# Patient Record
Sex: Male | Born: 1950
Health system: Southern US, Community
[De-identification: ages and names within clinical notes are randomized; demographics above are authoritative.]

## PROBLEM LIST (undated history)

## (undated) DIAGNOSIS — E785 Hyperlipidemia, unspecified: Secondary | ICD-10-CM

## (undated) DIAGNOSIS — I10 Essential (primary) hypertension: Secondary | ICD-10-CM

## (undated) DIAGNOSIS — I509 Heart failure, unspecified: Secondary | ICD-10-CM

## (undated) DIAGNOSIS — E119 Type 2 diabetes mellitus without complications: Secondary | ICD-10-CM

## (undated) HISTORY — PX: ABDOMINAL SURGERY: SHX537

## (undated) HISTORY — DX: Hyperlipidemia, unspecified: E78.5

## (undated) HISTORY — DX: Essential (primary) hypertension: I10

## (undated) HISTORY — PX: LEG SURGERY: SHX1003

## (undated) HISTORY — DX: Heart failure, unspecified: I50.9

## (undated) HISTORY — DX: Type 2 diabetes mellitus without complications: E11.9

---

## 2000-12-20 ENCOUNTER — Emergency Department (HOSPITAL_COMMUNITY): Admission: EM | Admit: 2000-12-20 | Discharge: 2000-12-20 | Payer: Self-pay | Admitting: Emergency Medicine

## 2002-04-21 ENCOUNTER — Emergency Department (HOSPITAL_COMMUNITY): Admission: EM | Admit: 2002-04-21 | Discharge: 2002-04-21 | Payer: Self-pay | Admitting: Emergency Medicine

## 2011-03-31 DIAGNOSIS — Q181 Preauricular sinus and cyst: Secondary | ICD-10-CM | POA: Insufficient documentation

## 2011-07-14 DIAGNOSIS — D751 Secondary polycythemia: Secondary | ICD-10-CM | POA: Insufficient documentation

## 2011-10-23 DIAGNOSIS — H6980 Other specified disorders of Eustachian tube, unspecified ear: Secondary | ICD-10-CM | POA: Insufficient documentation

## 2011-10-23 DIAGNOSIS — H908 Mixed conductive and sensorineural hearing loss, unspecified: Secondary | ICD-10-CM | POA: Insufficient documentation

## 2011-10-23 DIAGNOSIS — H6690 Otitis media, unspecified, unspecified ear: Secondary | ICD-10-CM | POA: Insufficient documentation

## 2013-02-28 DIAGNOSIS — F329 Major depressive disorder, single episode, unspecified: Secondary | ICD-10-CM | POA: Insufficient documentation

## 2014-06-19 DIAGNOSIS — Z79899 Other long term (current) drug therapy: Secondary | ICD-10-CM | POA: Diagnosis not present

## 2014-06-19 DIAGNOSIS — R5383 Other fatigue: Secondary | ICD-10-CM | POA: Diagnosis not present

## 2014-07-05 DIAGNOSIS — E291 Testicular hypofunction: Secondary | ICD-10-CM | POA: Diagnosis not present

## 2014-08-08 DIAGNOSIS — E291 Testicular hypofunction: Secondary | ICD-10-CM | POA: Diagnosis not present

## 2014-09-10 DIAGNOSIS — D510 Vitamin B12 deficiency anemia due to intrinsic factor deficiency: Secondary | ICD-10-CM | POA: Diagnosis not present

## 2014-09-10 DIAGNOSIS — E785 Hyperlipidemia, unspecified: Secondary | ICD-10-CM | POA: Diagnosis not present

## 2014-09-10 DIAGNOSIS — E291 Testicular hypofunction: Secondary | ICD-10-CM | POA: Diagnosis not present

## 2014-09-10 DIAGNOSIS — E1142 Type 2 diabetes mellitus with diabetic polyneuropathy: Secondary | ICD-10-CM | POA: Diagnosis not present

## 2014-09-10 DIAGNOSIS — Z79899 Other long term (current) drug therapy: Secondary | ICD-10-CM | POA: Diagnosis not present

## 2014-09-10 DIAGNOSIS — G894 Chronic pain syndrome: Secondary | ICD-10-CM | POA: Diagnosis not present

## 2014-09-10 DIAGNOSIS — Z6829 Body mass index (BMI) 29.0-29.9, adult: Secondary | ICD-10-CM | POA: Diagnosis not present

## 2014-10-11 DIAGNOSIS — E291 Testicular hypofunction: Secondary | ICD-10-CM | POA: Diagnosis not present

## 2014-11-06 DIAGNOSIS — E291 Testicular hypofunction: Secondary | ICD-10-CM | POA: Diagnosis not present

## 2014-12-07 DIAGNOSIS — E291 Testicular hypofunction: Secondary | ICD-10-CM | POA: Diagnosis not present

## 2014-12-18 DIAGNOSIS — D510 Vitamin B12 deficiency anemia due to intrinsic factor deficiency: Secondary | ICD-10-CM | POA: Diagnosis not present

## 2014-12-18 DIAGNOSIS — E1142 Type 2 diabetes mellitus with diabetic polyneuropathy: Secondary | ICD-10-CM | POA: Diagnosis not present

## 2014-12-18 DIAGNOSIS — E291 Testicular hypofunction: Secondary | ICD-10-CM | POA: Diagnosis not present

## 2014-12-18 DIAGNOSIS — Z125 Encounter for screening for malignant neoplasm of prostate: Secondary | ICD-10-CM | POA: Diagnosis not present

## 2014-12-18 DIAGNOSIS — Z79899 Other long term (current) drug therapy: Secondary | ICD-10-CM | POA: Diagnosis not present

## 2014-12-18 DIAGNOSIS — G894 Chronic pain syndrome: Secondary | ICD-10-CM | POA: Diagnosis not present

## 2014-12-18 DIAGNOSIS — Z23 Encounter for immunization: Secondary | ICD-10-CM | POA: Diagnosis not present

## 2014-12-18 DIAGNOSIS — E785 Hyperlipidemia, unspecified: Secondary | ICD-10-CM | POA: Diagnosis not present

## 2015-01-04 DIAGNOSIS — E291 Testicular hypofunction: Secondary | ICD-10-CM | POA: Diagnosis not present

## 2015-01-22 DIAGNOSIS — H524 Presbyopia: Secondary | ICD-10-CM | POA: Diagnosis not present

## 2015-01-22 DIAGNOSIS — H3509 Other intraretinal microvascular abnormalities: Secondary | ICD-10-CM | POA: Diagnosis not present

## 2015-01-22 DIAGNOSIS — E119 Type 2 diabetes mellitus without complications: Secondary | ICD-10-CM | POA: Diagnosis not present

## 2015-01-22 DIAGNOSIS — H5210 Myopia, unspecified eye: Secondary | ICD-10-CM | POA: Diagnosis not present

## 2015-02-01 DIAGNOSIS — E291 Testicular hypofunction: Secondary | ICD-10-CM | POA: Diagnosis not present

## 2015-03-25 DIAGNOSIS — Z79891 Long term (current) use of opiate analgesic: Secondary | ICD-10-CM | POA: Diagnosis not present

## 2015-03-25 DIAGNOSIS — E1142 Type 2 diabetes mellitus with diabetic polyneuropathy: Secondary | ICD-10-CM | POA: Diagnosis not present

## 2015-03-25 DIAGNOSIS — D510 Vitamin B12 deficiency anemia due to intrinsic factor deficiency: Secondary | ICD-10-CM | POA: Diagnosis not present

## 2015-03-25 DIAGNOSIS — E785 Hyperlipidemia, unspecified: Secondary | ICD-10-CM | POA: Diagnosis not present

## 2015-03-25 DIAGNOSIS — G894 Chronic pain syndrome: Secondary | ICD-10-CM | POA: Diagnosis not present

## 2015-03-25 DIAGNOSIS — E291 Testicular hypofunction: Secondary | ICD-10-CM | POA: Diagnosis not present

## 2015-03-25 DIAGNOSIS — Z23 Encounter for immunization: Secondary | ICD-10-CM | POA: Diagnosis not present

## 2015-03-25 DIAGNOSIS — Z6829 Body mass index (BMI) 29.0-29.9, adult: Secondary | ICD-10-CM | POA: Diagnosis not present

## 2015-04-22 DIAGNOSIS — E291 Testicular hypofunction: Secondary | ICD-10-CM | POA: Diagnosis not present

## 2015-04-30 DIAGNOSIS — L814 Other melanin hyperpigmentation: Secondary | ICD-10-CM | POA: Diagnosis not present

## 2015-04-30 DIAGNOSIS — L821 Other seborrheic keratosis: Secondary | ICD-10-CM | POA: Diagnosis not present

## 2015-05-20 DIAGNOSIS — E291 Testicular hypofunction: Secondary | ICD-10-CM | POA: Diagnosis not present

## 2015-06-18 DIAGNOSIS — E291 Testicular hypofunction: Secondary | ICD-10-CM | POA: Diagnosis not present

## 2015-06-27 DIAGNOSIS — E785 Hyperlipidemia, unspecified: Secondary | ICD-10-CM | POA: Diagnosis not present

## 2015-06-27 DIAGNOSIS — Z6831 Body mass index (BMI) 31.0-31.9, adult: Secondary | ICD-10-CM | POA: Diagnosis not present

## 2015-06-27 DIAGNOSIS — E1142 Type 2 diabetes mellitus with diabetic polyneuropathy: Secondary | ICD-10-CM | POA: Diagnosis not present

## 2015-06-27 DIAGNOSIS — Z79891 Long term (current) use of opiate analgesic: Secondary | ICD-10-CM | POA: Diagnosis not present

## 2015-06-27 DIAGNOSIS — D510 Vitamin B12 deficiency anemia due to intrinsic factor deficiency: Secondary | ICD-10-CM | POA: Diagnosis not present

## 2015-06-27 DIAGNOSIS — E291 Testicular hypofunction: Secondary | ICD-10-CM | POA: Diagnosis not present

## 2015-06-27 DIAGNOSIS — Z1389 Encounter for screening for other disorder: Secondary | ICD-10-CM | POA: Diagnosis not present

## 2015-06-27 DIAGNOSIS — G894 Chronic pain syndrome: Secondary | ICD-10-CM | POA: Diagnosis not present

## 2015-06-27 DIAGNOSIS — J309 Allergic rhinitis, unspecified: Secondary | ICD-10-CM | POA: Diagnosis not present

## 2015-07-22 DIAGNOSIS — E291 Testicular hypofunction: Secondary | ICD-10-CM | POA: Diagnosis not present

## 2015-08-19 DIAGNOSIS — E291 Testicular hypofunction: Secondary | ICD-10-CM | POA: Diagnosis not present

## 2015-09-16 DIAGNOSIS — E291 Testicular hypofunction: Secondary | ICD-10-CM | POA: Diagnosis not present

## 2015-09-26 DIAGNOSIS — E1142 Type 2 diabetes mellitus with diabetic polyneuropathy: Secondary | ICD-10-CM | POA: Diagnosis not present

## 2015-09-26 DIAGNOSIS — E669 Obesity, unspecified: Secondary | ICD-10-CM | POA: Diagnosis not present

## 2015-09-26 DIAGNOSIS — D510 Vitamin B12 deficiency anemia due to intrinsic factor deficiency: Secondary | ICD-10-CM | POA: Diagnosis not present

## 2015-09-26 DIAGNOSIS — G894 Chronic pain syndrome: Secondary | ICD-10-CM | POA: Diagnosis not present

## 2015-09-26 DIAGNOSIS — Z6830 Body mass index (BMI) 30.0-30.9, adult: Secondary | ICD-10-CM | POA: Diagnosis not present

## 2015-09-26 DIAGNOSIS — E785 Hyperlipidemia, unspecified: Secondary | ICD-10-CM | POA: Diagnosis not present

## 2015-09-26 DIAGNOSIS — Z79891 Long term (current) use of opiate analgesic: Secondary | ICD-10-CM | POA: Diagnosis not present

## 2015-09-26 DIAGNOSIS — E291 Testicular hypofunction: Secondary | ICD-10-CM | POA: Diagnosis not present

## 2015-10-16 DIAGNOSIS — E291 Testicular hypofunction: Secondary | ICD-10-CM | POA: Diagnosis not present

## 2015-11-18 DIAGNOSIS — E291 Testicular hypofunction: Secondary | ICD-10-CM | POA: Diagnosis not present

## 2015-12-20 DIAGNOSIS — E291 Testicular hypofunction: Secondary | ICD-10-CM | POA: Diagnosis not present

## 2015-12-26 DIAGNOSIS — I10 Essential (primary) hypertension: Secondary | ICD-10-CM | POA: Diagnosis not present

## 2015-12-26 DIAGNOSIS — E785 Hyperlipidemia, unspecified: Secondary | ICD-10-CM | POA: Diagnosis not present

## 2015-12-26 DIAGNOSIS — G9050 Complex regional pain syndrome I, unspecified: Secondary | ICD-10-CM | POA: Diagnosis not present

## 2015-12-26 DIAGNOSIS — D510 Vitamin B12 deficiency anemia due to intrinsic factor deficiency: Secondary | ICD-10-CM | POA: Diagnosis not present

## 2015-12-26 DIAGNOSIS — E1142 Type 2 diabetes mellitus with diabetic polyneuropathy: Secondary | ICD-10-CM | POA: Diagnosis not present

## 2015-12-26 DIAGNOSIS — E291 Testicular hypofunction: Secondary | ICD-10-CM | POA: Diagnosis not present

## 2015-12-26 DIAGNOSIS — G894 Chronic pain syndrome: Secondary | ICD-10-CM | POA: Diagnosis not present

## 2015-12-26 DIAGNOSIS — Z79891 Long term (current) use of opiate analgesic: Secondary | ICD-10-CM | POA: Diagnosis not present

## 2015-12-26 DIAGNOSIS — Z125 Encounter for screening for malignant neoplasm of prostate: Secondary | ICD-10-CM | POA: Diagnosis not present

## 2016-01-20 DIAGNOSIS — E291 Testicular hypofunction: Secondary | ICD-10-CM | POA: Diagnosis not present

## 2016-02-18 DIAGNOSIS — E291 Testicular hypofunction: Secondary | ICD-10-CM | POA: Diagnosis not present

## 2016-02-18 DIAGNOSIS — D510 Vitamin B12 deficiency anemia due to intrinsic factor deficiency: Secondary | ICD-10-CM | POA: Diagnosis not present

## 2016-03-30 DIAGNOSIS — G894 Chronic pain syndrome: Secondary | ICD-10-CM | POA: Diagnosis not present

## 2016-03-30 DIAGNOSIS — Z23 Encounter for immunization: Secondary | ICD-10-CM | POA: Diagnosis not present

## 2016-03-30 DIAGNOSIS — Z79891 Long term (current) use of opiate analgesic: Secondary | ICD-10-CM | POA: Diagnosis not present

## 2016-03-30 DIAGNOSIS — E785 Hyperlipidemia, unspecified: Secondary | ICD-10-CM | POA: Diagnosis not present

## 2016-03-30 DIAGNOSIS — I10 Essential (primary) hypertension: Secondary | ICD-10-CM | POA: Diagnosis not present

## 2016-03-30 DIAGNOSIS — E1142 Type 2 diabetes mellitus with diabetic polyneuropathy: Secondary | ICD-10-CM | POA: Diagnosis not present

## 2016-03-30 DIAGNOSIS — G9050 Complex regional pain syndrome I, unspecified: Secondary | ICD-10-CM | POA: Diagnosis not present

## 2016-03-30 DIAGNOSIS — D510 Vitamin B12 deficiency anemia due to intrinsic factor deficiency: Secondary | ICD-10-CM | POA: Diagnosis not present

## 2016-03-30 DIAGNOSIS — E349 Endocrine disorder, unspecified: Secondary | ICD-10-CM | POA: Diagnosis not present

## 2016-04-30 DIAGNOSIS — D510 Vitamin B12 deficiency anemia due to intrinsic factor deficiency: Secondary | ICD-10-CM | POA: Diagnosis not present

## 2016-04-30 DIAGNOSIS — E349 Endocrine disorder, unspecified: Secondary | ICD-10-CM | POA: Diagnosis not present

## 2016-06-01 DIAGNOSIS — D510 Vitamin B12 deficiency anemia due to intrinsic factor deficiency: Secondary | ICD-10-CM | POA: Diagnosis not present

## 2016-06-01 DIAGNOSIS — E349 Endocrine disorder, unspecified: Secondary | ICD-10-CM | POA: Diagnosis not present

## 2016-06-02 DIAGNOSIS — H3509 Other intraretinal microvascular abnormalities: Secondary | ICD-10-CM | POA: Diagnosis not present

## 2016-06-02 DIAGNOSIS — H5202 Hypermetropia, left eye: Secondary | ICD-10-CM | POA: Diagnosis not present

## 2016-09-01 DIAGNOSIS — G894 Chronic pain syndrome: Secondary | ICD-10-CM | POA: Diagnosis not present

## 2016-09-01 DIAGNOSIS — Z1389 Encounter for screening for other disorder: Secondary | ICD-10-CM | POA: Diagnosis not present

## 2016-09-01 DIAGNOSIS — D510 Vitamin B12 deficiency anemia due to intrinsic factor deficiency: Secondary | ICD-10-CM | POA: Diagnosis not present

## 2016-09-01 DIAGNOSIS — Z79891 Long term (current) use of opiate analgesic: Secondary | ICD-10-CM | POA: Diagnosis not present

## 2016-09-01 DIAGNOSIS — E785 Hyperlipidemia, unspecified: Secondary | ICD-10-CM | POA: Diagnosis not present

## 2016-09-01 DIAGNOSIS — E1142 Type 2 diabetes mellitus with diabetic polyneuropathy: Secondary | ICD-10-CM | POA: Diagnosis not present

## 2016-09-01 DIAGNOSIS — I10 Essential (primary) hypertension: Secondary | ICD-10-CM | POA: Diagnosis not present

## 2016-09-01 DIAGNOSIS — Z9181 History of falling: Secondary | ICD-10-CM | POA: Diagnosis not present

## 2016-09-01 DIAGNOSIS — E291 Testicular hypofunction: Secondary | ICD-10-CM | POA: Diagnosis not present

## 2016-09-01 DIAGNOSIS — G9050 Complex regional pain syndrome I, unspecified: Secondary | ICD-10-CM | POA: Diagnosis not present

## 2016-10-16 ENCOUNTER — Ambulatory Visit (INDEPENDENT_AMBULATORY_CARE_PROVIDER_SITE_OTHER): Payer: Medicare HMO | Admitting: Podiatry

## 2016-10-16 ENCOUNTER — Encounter: Payer: Self-pay | Admitting: Podiatry

## 2016-10-16 VITALS — BP 165/84 | HR 85 | Resp 16

## 2016-10-16 DIAGNOSIS — L603 Nail dystrophy: Secondary | ICD-10-CM

## 2016-10-16 DIAGNOSIS — M79674 Pain in right toe(s): Secondary | ICD-10-CM | POA: Diagnosis not present

## 2016-10-16 DIAGNOSIS — M79675 Pain in left toe(s): Secondary | ICD-10-CM

## 2016-10-16 MED ORDER — CEPHALEXIN 500 MG PO CAPS: 500.0000 mg | ORAL_CAPSULE | Freq: Three times a day (TID) | ORAL | 2 refills | Status: DC

## 2016-10-16 NOTE — Progress Notes (Signed)
   Subjective:    Patient ID: Brent Mendoza, male    DOB: [birth year], 66 y.o.   MRN: [id number]  HPI 66 year old male presents the office today for concerns of thick, discolored toenails to both of his big toes and fifth toenails he states the nails are painful shoe gear. He is requesting toenails be removed today. He had another timeout previously done well. He denies any swelling or redness or drainage from the nail sites. He has no other complaints today.    Review of Systems  Musculoskeletal: Positive for arthralgias, back pain, gait problem and myalgias.  All other systems reviewed and are negative.      Objective:   Physical Exam General: AAO x3, NAD  Dermatological: Bilateral hallux and fifth digit toenails are hypertrophic, discolored, dystrophic tenderness entire toenail. The left hallux toenail is partially removed to try to do it himself. There is no surrounding erythema, drainage or pus or clinical signs of infection.  Vascular: Dorsalis Pedis artery and Posterior Tibial artery pedal pulses are 2/4 bilateral with immedate capillary fill time. Pedal hair growth present.  There is no pain with calf compression, swelling, warmth, erythema.   Neruologic: Grossly intact via light touch bilateral. Vibratory intact via tuning fork bilateral. Protective threshold with Semmes Wienstein monofilament intact to all pedal sites bilateral.   Musculoskeletal: Tenderness to bilateral hallux and fifth digit toenails. No other areas of tenderness. Muscular strength 5/5 in all groups tested bilateral.  Gait: Unassisted, Nonantalgic.     Assessment & Plan:  66 year old male bilateral hallux and fifth digit onychodystrophy symptomatic -Treatment options discussed including all alternatives, risks, and complications -Etiology of symptoms were discussed -At this time, the patient is requesting partial nail removal with chemical matricectomy to the symptomatic portion of the nail. Risks and  complications were discussed with the patient for which they understand and  verbally consent to the procedure. Under sterile conditions a total of 3 mL of a mixture of 2% lidocaine plain and 0.5% Marcaine plain was infiltrated in a digital block fashion. Once anesthetized, the skin was prepped in sterile fashion. A tourniquet was then applied. Next the bilateral hallux and fifth digit toenails were then excised making sure to remove the entire offending nail border. Once the nails were ensured to be removed area was debrided and the underlying skin was intact. There is no purulence identified in the procedure. Next phenol was then applied under standard conditions and copiously irrigated. Silvadene was applied. A dry sterile dressing was applied. After application of the dressing the tourniquet was removed and there is found to be an immediate capillary refill time to the digit. The patient tolerated the procedure well any complications. Post procedure instructions were discussed the patient for which he verbally understood. Follow-up in one week for nail check or sooner if any problems are to arise. Discussed signs/symptoms of infection and directed to call the office immediately should any occur or go directly to the emergency room. In the meantime, encouraged to call the office with any questions, concerns, changes symptoms.  Celesta Gentile, DPM

## 2016-10-16 NOTE — Patient Instructions (Signed)

## 2016-10-26 ENCOUNTER — Encounter: Payer: Self-pay | Admitting: Podiatry

## 2016-10-26 ENCOUNTER — Ambulatory Visit (INDEPENDENT_AMBULATORY_CARE_PROVIDER_SITE_OTHER): Payer: Self-pay | Admitting: Podiatry

## 2016-10-26 DIAGNOSIS — L603 Nail dystrophy: Secondary | ICD-10-CM

## 2016-10-26 DIAGNOSIS — M79674 Pain in right toe(s): Secondary | ICD-10-CM

## 2016-10-26 DIAGNOSIS — M79675 Pain in left toe(s): Secondary | ICD-10-CM

## 2016-10-26 NOTE — Progress Notes (Signed)
Subjective: Brent Mendoza is a 66 y.o.  male returns to office today for follow up evaluation after having bilateral Hallux and bilateral fifth digit total nail avulsion performed. Patient has been soaking using epsom salts until 2 days ago and applying topical antibiotic covered with bandaid daily. Patient denies fevers, chills, nausea, vomiting. Denies any calf pain, chest pain, SOB.   Objective:  Vitals: Reviewed  General: Well developed, nourished, in no acute distress, alert and oriented x3   Dermatology: Skin is warm, dry and supple bilateral. Bilateral hallux and 5th digit nail bed appears to be clean, dry, with mild granular tissue and surrounding scab. There is no surrounding erythema, edema, pus. There is slight clear drainage. The remaining nails appear unremarkable at this time. There are no other lesions or other signs of infection present.  Neurovascular status: Intact. No lower extremity swelling; No pain with calf compression bilateral.  Musculoskeletal: No tenderness to palpation of the bilateral hallux and 5th digit nail beds. Muscular strength within normal limits bilateral.   Assesement and Plan: S/p partial nail avulsion, doing well.   -Continue soaking in epsom salts twice a day followed by antibiotic ointment and a band-aid. Can leave uncovered at night. Continue this until completely healed.  -If the area has not healed in 2 weeks, call the office for follow-up appointment, or sooner if any problems arise.  -Monitor for any signs/symptoms of infection. Call the office immediately if any occur or go directly to the emergency room. Call with any questions/concerns.  *The other nails are painful and thick as well. At next appointment would like to have the right 2nd, 3rd, 4th digit toenails removed permanently   Celesta Gentile, DPM

## 2016-10-26 NOTE — Patient Instructions (Signed)

## 2016-11-04 DIAGNOSIS — E291 Testicular hypofunction: Secondary | ICD-10-CM | POA: Diagnosis not present

## 2016-11-04 DIAGNOSIS — D510 Vitamin B12 deficiency anemia due to intrinsic factor deficiency: Secondary | ICD-10-CM | POA: Diagnosis not present

## 2016-11-12 ENCOUNTER — Ambulatory Visit (INDEPENDENT_AMBULATORY_CARE_PROVIDER_SITE_OTHER): Payer: Medicare HMO | Admitting: Podiatry

## 2016-11-12 ENCOUNTER — Encounter: Payer: Self-pay | Admitting: Podiatry

## 2016-11-12 DIAGNOSIS — M79675 Pain in left toe(s): Secondary | ICD-10-CM | POA: Diagnosis not present

## 2016-11-12 DIAGNOSIS — L603 Nail dystrophy: Secondary | ICD-10-CM

## 2016-11-12 DIAGNOSIS — M79674 Pain in right toe(s): Secondary | ICD-10-CM

## 2016-11-12 MED ORDER — CEPHALEXIN 500 MG PO CAPS: 500.0000 mg | ORAL_CAPSULE | Freq: Three times a day (TID) | ORAL | 0 refills | Status: DC

## 2016-11-12 NOTE — Patient Instructions (Addendum)

## 2016-11-12 NOTE — Progress Notes (Signed)
Subjective: 66 year old male presents the office today requesting a his right second, third, fourth digit nails removed permanently as they're thick and causing irritation and pain which is been ongoing for several years. Denies any redness or drainage from the nail sites. He states that her nails are to doing well he's not had any drainage or pus in the scab is starting to form. He has occasional discomfort on the big toe which feels it may still be ingrown is not noticed any toenail. Denies any systemic complaints such as fevers, chills, nausea, vomiting. No acute changes since last appointment, and no other complaints at this time.   Objective: AAO x3, NAD DP/PT pulses palpable bilaterally, CRT less than 3 seconds Procedure sites from the previous procedures are well healed. On the left hallux toenail not able to visualize any toenail to the area. There is no drainage or pus coming from the procedure size there is no pain. On the right second, third, fourth digit and elevate. Be hypertrophic, dystrophic, discolored and there is pain the entire toenail. No surrounding redness or drainage. Thick hyperkeratotic lesions present bilateral symmetrical 1. No open lesions or pre-ulcerative lesions.  No pain with calf compression, swelling, warmth, erythema  Assessment: Healed procedure sites bilaterally requesting permanent nail avulsion of the right 2, 3, 4   Plan: -All treatment options discussed with the patient including all alternatives, risks, complications.  -Procedures are healing well however monitor for any  Infection -At this time, the patient is requesting partial nail removal with chemical matricectomy to the symptomatic portion of the nail. Risks and complications were discussed with the patient for which they understand and  verbally consent to the procedure. Under sterile conditions a total of 3 mL of a mixture of 2% lidocaine plain and 0.5% Marcaine plain was infiltrated in a digital  block fashion to each digit. Once anesthetized, the skin was prepped in sterile fashion. A tourniquet was then applied. Next the right 2nd, 3rd, and 4th nails were then  excised making sure to remove the entire offending nail border. Once the nails were ensured to be removed area was debrided and the underlying skin was intact. There is no purulence identified in the procedure. Next phenol was then applied under standard conditions and copiously irrigated. Silvadene was applied. A dry sterile dressing was applied. After application of the dressing the tourniquet was removed and there is found to be an immediate capillary refill time to the digit. The patient tolerated the procedure well any complications. Post procedure instructions were discussed the patient for which he verbally understood. Follow-up in 2 weeks for nail check or sooner if any problems are to arise. Discussed signs/symptoms of infection and directed to call the office immediately should any occur or go directly to the emergency room. In the meantime, encouraged to call the office with any questions, concerns, changes symptoms. -Patient encouraged to call the office with any questions, concerns, change in symptoms.  -Keflex  Celesta Gentile, DPM

## 2016-11-26 ENCOUNTER — Ambulatory Visit (INDEPENDENT_AMBULATORY_CARE_PROVIDER_SITE_OTHER): Payer: Medicare HMO | Admitting: Podiatry

## 2016-11-26 ENCOUNTER — Encounter: Payer: Self-pay | Admitting: Podiatry

## 2016-11-26 DIAGNOSIS — Z9889 Other specified postprocedural states: Secondary | ICD-10-CM

## 2016-11-26 DIAGNOSIS — L603 Nail dystrophy: Secondary | ICD-10-CM

## 2016-11-29 NOTE — Progress Notes (Signed)
Subjective: Brent Mendoza is a 66 y.o.  male returns to office today for follow up evaluation after having right 2-4th digit total nail avulsion performed. Patient has been soaking using epsom salts and applying topical antibiotic covered with bandaid when he "thinks about it". Denies any pain, pus or red streaking. Patient denies fevers, chills, nausea, vomiting. Denies any calf pain, chest pain, SOB.   Objective:  Vitals: Reviewed  General: Well developed, nourished, in no acute distress, alert and oriented x3   Dermatology: Skin is warm, dry and supple bilateral. Right 2nd-4th nail bedappears to be clean, dry, with mild granular tissue and surrounding scab. There is minimal surrounding erythema without any edema, ascending cellulitis, drainage/purulence. The remaining nails appear unremarkable at this time. There are no other lesions or other signs of infection present.  Neurovascular status: Intact. No lower extremity swelling; No pain with calf compression bilateral.  Musculoskeletal: No tenderness to palpation of the nail beds. Muscular strength within normal limits bilateral.   Assesement and Plan: S/p partial nail avulsion, doing well.   -Continue soaking in epsom salts twice a day followed by antibiotic ointment and a band-aid. Can leave uncovered at night. Continue this until completely healed.  -If the area has not healed in 2 weeks, call the office for follow-up appointment, or sooner if any problems arise.  -Monitor for any signs/symptoms of infection. Call the office immediately if any occur or go directly to the emergency room. Call with any questions/concerns.  Celesta Gentile, DPM

## 2016-12-07 DIAGNOSIS — D510 Vitamin B12 deficiency anemia due to intrinsic factor deficiency: Secondary | ICD-10-CM | POA: Diagnosis not present

## 2016-12-07 DIAGNOSIS — Z125 Encounter for screening for malignant neoplasm of prostate: Secondary | ICD-10-CM | POA: Diagnosis not present

## 2016-12-07 DIAGNOSIS — G894 Chronic pain syndrome: Secondary | ICD-10-CM | POA: Diagnosis not present

## 2016-12-07 DIAGNOSIS — Z23 Encounter for immunization: Secondary | ICD-10-CM | POA: Diagnosis not present

## 2016-12-07 DIAGNOSIS — E785 Hyperlipidemia, unspecified: Secondary | ICD-10-CM | POA: Diagnosis not present

## 2016-12-07 DIAGNOSIS — G9050 Complex regional pain syndrome I, unspecified: Secondary | ICD-10-CM | POA: Diagnosis not present

## 2016-12-07 DIAGNOSIS — E291 Testicular hypofunction: Secondary | ICD-10-CM | POA: Diagnosis not present

## 2016-12-07 DIAGNOSIS — Z79891 Long term (current) use of opiate analgesic: Secondary | ICD-10-CM | POA: Diagnosis not present

## 2016-12-07 DIAGNOSIS — R5383 Other fatigue: Secondary | ICD-10-CM | POA: Diagnosis not present

## 2016-12-07 DIAGNOSIS — I10 Essential (primary) hypertension: Secondary | ICD-10-CM | POA: Diagnosis not present

## 2016-12-07 DIAGNOSIS — E1142 Type 2 diabetes mellitus with diabetic polyneuropathy: Secondary | ICD-10-CM | POA: Diagnosis not present

## 2016-12-07 DIAGNOSIS — F419 Anxiety disorder, unspecified: Secondary | ICD-10-CM | POA: Diagnosis not present

## 2016-12-21 ENCOUNTER — Ambulatory Visit (INDEPENDENT_AMBULATORY_CARE_PROVIDER_SITE_OTHER): Payer: Medicare HMO | Admitting: Podiatry

## 2016-12-21 ENCOUNTER — Encounter: Payer: Self-pay | Admitting: Podiatry

## 2016-12-21 DIAGNOSIS — M79675 Pain in left toe(s): Secondary | ICD-10-CM

## 2016-12-21 DIAGNOSIS — M79674 Pain in right toe(s): Secondary | ICD-10-CM | POA: Diagnosis not present

## 2016-12-21 DIAGNOSIS — L84 Corns and callosities: Secondary | ICD-10-CM

## 2016-12-21 DIAGNOSIS — E291 Testicular hypofunction: Secondary | ICD-10-CM | POA: Diagnosis not present

## 2016-12-21 DIAGNOSIS — L603 Nail dystrophy: Secondary | ICD-10-CM

## 2016-12-21 MED ORDER — CEPHALEXIN 500 MG PO CAPS: 500.0000 mg | ORAL_CAPSULE | Freq: Three times a day (TID) | ORAL | 0 refills | Status: DC

## 2016-12-21 NOTE — Patient Instructions (Signed)

## 2016-12-25 NOTE — Progress Notes (Signed)
Subjective: 66 year old male presents the office they requested in his left second, third, fourth digit toenails be removed. He's had the other nails removed and is point was obvious nails removed as well as they are thick and painful. He denies any redness or drainage to the nail sites. He also has a callus in the left big toe that he got trimmed. The other nail sites are healing well denies any drainage or pus or any redness. Denies any systemic complaints such as fevers, chills, nausea, vomiting. No acute changes since last appointment, and no other complaints at this time.   Objective: AAO x3, NAD DP/PT pulses palpable bilaterally, CRT less than 3 seconds Left second, third, fourth digit nails are hypertrophic, dystrophic, discolored and is tenderness to the nails. No swelling redness or drainage. The procedure sites from the other procedures are healing well, completely healed. There is no drainage or pus or any swelling redness. Hyperkeratotic lesion left medial hallux. Upon debridement areas pre-ulcerative, there is no open lesion identified at this time. No open lesions or pre-ulcerative lesions.  No pain with calf compression, swelling, warmth, erythema  Assessment: 66 year old male symptomatic onychodystrophy left second, third, fourth digit toenails requesting removal permanently; hyperkeratotic lesion  Plan: -All treatment options discussed with the patient including all alternatives, risks, complications.  -At this time, the patient is requesting partial nail removal with chemical matricectomy to the symptomatic portion of the nail. Risks and complications were discussed with the patient for which they understand and  verbally consent to the procedure. Under sterile conditions a total of 3 mL of a mixture of 2% lidocaine plain and 0.5% Marcaine plain was infiltrated in a digital block fashion. Once anesthetized, the skin was prepped in sterile fashion. A tourniquet was then applied. Next  the left 2nd, 3rd, 4th digit toenails were then excised making sure to remove the entire offending nail border. Once the nails were ensured to be removed area was debrided and the underlying skin was intact. There is no purulence identified in the procedure. Next phenol was then applied under standard conditions and copiously irrigated. Silvadene was applied. A dry sterile dressing was applied. After application of the dressing the tourniquet was removed and there is found to be an immediate capillary refill time to the digit. The patient tolerated the procedure well any complications. Post procedure instructions were discussed the patient for which he verbally understood.  Discussed signs/symptoms of infection and directed to call the office immediately should any occur or go directly to the emergency room. In the meantime, encouraged to call the office with any questions, concerns, changes symptoms. -Hyperkeratotic lesion left foot sharply debrided without, occasions or bleeding 1 -Followed 2 weeks or sooner if needed. -Patient encouraged to call the office with any questions, concerns, change in symptoms.   Celesta Gentile, DPM

## 2017-01-07 DIAGNOSIS — D519 Vitamin B12 deficiency anemia, unspecified: Secondary | ICD-10-CM | POA: Diagnosis not present

## 2017-01-07 DIAGNOSIS — E291 Testicular hypofunction: Secondary | ICD-10-CM | POA: Diagnosis not present

## 2017-01-08 ENCOUNTER — Ambulatory Visit: Payer: Medicare HMO | Admitting: Podiatry

## 2017-01-21 DIAGNOSIS — E291 Testicular hypofunction: Secondary | ICD-10-CM | POA: Diagnosis not present

## 2017-02-04 ENCOUNTER — Telehealth: Payer: Self-pay | Admitting: Podiatry

## 2017-02-04 NOTE — Telephone Encounter (Signed)
Yes, this is [place] requesting a new prescription on cephalexin. Please call us at [phone] for fax [place] in a new prescription to [phone].

## 2017-02-04 NOTE — Telephone Encounter (Addendum)
Dr. Jacqualyn Posey states pt needs an appt if he is requesting an antibiotic. Unable to leave a message informing pt he needed an appt if he needed an antibiotic. 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 Returned fax to [facility] stating pt needs an appt.

## 2017-02-08 DIAGNOSIS — D510 Vitamin B12 deficiency anemia due to intrinsic factor deficiency: Secondary | ICD-10-CM | POA: Diagnosis not present

## 2017-02-08 DIAGNOSIS — E291 Testicular hypofunction: Secondary | ICD-10-CM | POA: Diagnosis not present

## 2017-02-22 DIAGNOSIS — E291 Testicular hypofunction: Secondary | ICD-10-CM | POA: Diagnosis not present

## 2017-03-09 DIAGNOSIS — G9050 Complex regional pain syndrome I, unspecified: Secondary | ICD-10-CM | POA: Diagnosis not present

## 2017-03-09 DIAGNOSIS — E785 Hyperlipidemia, unspecified: Secondary | ICD-10-CM | POA: Diagnosis not present

## 2017-03-09 DIAGNOSIS — G894 Chronic pain syndrome: Secondary | ICD-10-CM | POA: Diagnosis not present

## 2017-03-09 DIAGNOSIS — E291 Testicular hypofunction: Secondary | ICD-10-CM | POA: Diagnosis not present

## 2017-03-09 DIAGNOSIS — Z6831 Body mass index (BMI) 31.0-31.9, adult: Secondary | ICD-10-CM | POA: Diagnosis not present

## 2017-03-09 DIAGNOSIS — E1142 Type 2 diabetes mellitus with diabetic polyneuropathy: Secondary | ICD-10-CM | POA: Diagnosis not present

## 2017-03-09 DIAGNOSIS — F419 Anxiety disorder, unspecified: Secondary | ICD-10-CM | POA: Diagnosis not present

## 2017-03-09 DIAGNOSIS — D510 Vitamin B12 deficiency anemia due to intrinsic factor deficiency: Secondary | ICD-10-CM | POA: Diagnosis not present

## 2017-03-09 DIAGNOSIS — I10 Essential (primary) hypertension: Secondary | ICD-10-CM | POA: Diagnosis not present

## 2017-03-23 DIAGNOSIS — E291 Testicular hypofunction: Secondary | ICD-10-CM | POA: Diagnosis not present

## 2017-04-08 DIAGNOSIS — Z6831 Body mass index (BMI) 31.0-31.9, adult: Secondary | ICD-10-CM | POA: Diagnosis not present

## 2017-04-08 DIAGNOSIS — Z0000 Encounter for general adult medical examination without abnormal findings: Secondary | ICD-10-CM | POA: Diagnosis not present

## 2017-04-08 DIAGNOSIS — Z125 Encounter for screening for malignant neoplasm of prostate: Secondary | ICD-10-CM | POA: Diagnosis not present

## 2017-04-08 DIAGNOSIS — E785 Hyperlipidemia, unspecified: Secondary | ICD-10-CM | POA: Diagnosis not present

## 2017-04-08 DIAGNOSIS — D510 Vitamin B12 deficiency anemia due to intrinsic factor deficiency: Secondary | ICD-10-CM | POA: Diagnosis not present

## 2017-04-08 DIAGNOSIS — Z9181 History of falling: Secondary | ICD-10-CM | POA: Diagnosis not present

## 2017-04-08 DIAGNOSIS — E669 Obesity, unspecified: Secondary | ICD-10-CM | POA: Diagnosis not present

## 2017-04-08 DIAGNOSIS — Z136 Encounter for screening for cardiovascular disorders: Secondary | ICD-10-CM | POA: Diagnosis not present

## 2017-04-08 DIAGNOSIS — Z1331 Encounter for screening for depression: Secondary | ICD-10-CM | POA: Diagnosis not present

## 2017-04-23 DIAGNOSIS — E291 Testicular hypofunction: Secondary | ICD-10-CM | POA: Diagnosis not present

## 2017-05-10 DIAGNOSIS — D510 Vitamin B12 deficiency anemia due to intrinsic factor deficiency: Secondary | ICD-10-CM | POA: Diagnosis not present

## 2017-05-10 DIAGNOSIS — E291 Testicular hypofunction: Secondary | ICD-10-CM | POA: Diagnosis not present

## 2017-06-16 DIAGNOSIS — Z6830 Body mass index (BMI) 30.0-30.9, adult: Secondary | ICD-10-CM | POA: Diagnosis not present

## 2017-06-16 DIAGNOSIS — E1142 Type 2 diabetes mellitus with diabetic polyneuropathy: Secondary | ICD-10-CM | POA: Diagnosis not present

## 2017-06-16 DIAGNOSIS — D510 Vitamin B12 deficiency anemia due to intrinsic factor deficiency: Secondary | ICD-10-CM | POA: Diagnosis not present

## 2017-06-16 DIAGNOSIS — G894 Chronic pain syndrome: Secondary | ICD-10-CM | POA: Diagnosis not present

## 2017-06-16 DIAGNOSIS — I10 Essential (primary) hypertension: Secondary | ICD-10-CM | POA: Diagnosis not present

## 2017-06-16 DIAGNOSIS — E291 Testicular hypofunction: Secondary | ICD-10-CM | POA: Diagnosis not present

## 2017-06-16 DIAGNOSIS — E785 Hyperlipidemia, unspecified: Secondary | ICD-10-CM | POA: Diagnosis not present

## 2017-06-16 DIAGNOSIS — F419 Anxiety disorder, unspecified: Secondary | ICD-10-CM | POA: Diagnosis not present

## 2017-06-16 DIAGNOSIS — G9050 Complex regional pain syndrome I, unspecified: Secondary | ICD-10-CM | POA: Diagnosis not present

## 2017-06-30 DIAGNOSIS — E291 Testicular hypofunction: Secondary | ICD-10-CM | POA: Diagnosis not present

## 2017-07-14 DIAGNOSIS — E291 Testicular hypofunction: Secondary | ICD-10-CM | POA: Diagnosis not present

## 2017-07-20 DIAGNOSIS — H52202 Unspecified astigmatism, left eye: Secondary | ICD-10-CM | POA: Diagnosis not present

## 2017-07-28 DIAGNOSIS — E291 Testicular hypofunction: Secondary | ICD-10-CM | POA: Diagnosis not present

## 2017-08-11 DIAGNOSIS — E291 Testicular hypofunction: Secondary | ICD-10-CM | POA: Diagnosis not present

## 2017-08-11 DIAGNOSIS — D510 Vitamin B12 deficiency anemia due to intrinsic factor deficiency: Secondary | ICD-10-CM | POA: Diagnosis not present

## 2017-08-25 DIAGNOSIS — E291 Testicular hypofunction: Secondary | ICD-10-CM | POA: Diagnosis not present

## 2017-09-09 DIAGNOSIS — D510 Vitamin B12 deficiency anemia due to intrinsic factor deficiency: Secondary | ICD-10-CM | POA: Diagnosis not present

## 2017-09-09 DIAGNOSIS — E291 Testicular hypofunction: Secondary | ICD-10-CM | POA: Diagnosis not present

## 2017-09-22 DIAGNOSIS — E291 Testicular hypofunction: Secondary | ICD-10-CM | POA: Diagnosis not present

## 2017-09-22 DIAGNOSIS — G9050 Complex regional pain syndrome I, unspecified: Secondary | ICD-10-CM | POA: Diagnosis not present

## 2017-09-22 DIAGNOSIS — Z139 Encounter for screening, unspecified: Secondary | ICD-10-CM | POA: Diagnosis not present

## 2017-09-22 DIAGNOSIS — D510 Vitamin B12 deficiency anemia due to intrinsic factor deficiency: Secondary | ICD-10-CM | POA: Diagnosis not present

## 2017-09-22 DIAGNOSIS — I10 Essential (primary) hypertension: Secondary | ICD-10-CM | POA: Diagnosis not present

## 2017-09-22 DIAGNOSIS — E785 Hyperlipidemia, unspecified: Secondary | ICD-10-CM | POA: Diagnosis not present

## 2017-09-22 DIAGNOSIS — Z79899 Other long term (current) drug therapy: Secondary | ICD-10-CM | POA: Diagnosis not present

## 2017-09-22 DIAGNOSIS — E1142 Type 2 diabetes mellitus with diabetic polyneuropathy: Secondary | ICD-10-CM | POA: Diagnosis not present

## 2017-09-22 DIAGNOSIS — Z1331 Encounter for screening for depression: Secondary | ICD-10-CM | POA: Diagnosis not present

## 2017-09-22 DIAGNOSIS — G894 Chronic pain syndrome: Secondary | ICD-10-CM | POA: Diagnosis not present

## 2017-10-06 DIAGNOSIS — J208 Acute bronchitis due to other specified organisms: Secondary | ICD-10-CM | POA: Diagnosis not present

## 2017-10-06 DIAGNOSIS — D510 Vitamin B12 deficiency anemia due to intrinsic factor deficiency: Secondary | ICD-10-CM | POA: Diagnosis not present

## 2017-10-06 DIAGNOSIS — J449 Chronic obstructive pulmonary disease, unspecified: Secondary | ICD-10-CM | POA: Diagnosis not present

## 2017-10-06 DIAGNOSIS — E291 Testicular hypofunction: Secondary | ICD-10-CM | POA: Diagnosis not present

## 2017-10-20 DIAGNOSIS — E291 Testicular hypofunction: Secondary | ICD-10-CM | POA: Diagnosis not present

## 2017-11-05 DIAGNOSIS — E291 Testicular hypofunction: Secondary | ICD-10-CM | POA: Diagnosis not present

## 2017-11-19 DIAGNOSIS — E291 Testicular hypofunction: Secondary | ICD-10-CM | POA: Diagnosis not present

## 2017-11-19 DIAGNOSIS — D510 Vitamin B12 deficiency anemia due to intrinsic factor deficiency: Secondary | ICD-10-CM | POA: Diagnosis not present

## 2017-12-03 DIAGNOSIS — E291 Testicular hypofunction: Secondary | ICD-10-CM | POA: Diagnosis not present

## 2017-12-29 DIAGNOSIS — Z79891 Long term (current) use of opiate analgesic: Secondary | ICD-10-CM | POA: Diagnosis not present

## 2017-12-29 DIAGNOSIS — G894 Chronic pain syndrome: Secondary | ICD-10-CM | POA: Diagnosis not present

## 2017-12-29 DIAGNOSIS — D510 Vitamin B12 deficiency anemia due to intrinsic factor deficiency: Secondary | ICD-10-CM | POA: Diagnosis not present

## 2017-12-29 DIAGNOSIS — E785 Hyperlipidemia, unspecified: Secondary | ICD-10-CM | POA: Diagnosis not present

## 2017-12-29 DIAGNOSIS — I10 Essential (primary) hypertension: Secondary | ICD-10-CM | POA: Diagnosis not present

## 2017-12-29 DIAGNOSIS — E291 Testicular hypofunction: Secondary | ICD-10-CM | POA: Diagnosis not present

## 2017-12-29 DIAGNOSIS — G9050 Complex regional pain syndrome I, unspecified: Secondary | ICD-10-CM | POA: Diagnosis not present

## 2017-12-29 DIAGNOSIS — Z1339 Encounter for screening examination for other mental health and behavioral disorders: Secondary | ICD-10-CM | POA: Diagnosis not present

## 2017-12-29 DIAGNOSIS — F419 Anxiety disorder, unspecified: Secondary | ICD-10-CM | POA: Diagnosis not present

## 2017-12-29 DIAGNOSIS — Z125 Encounter for screening for malignant neoplasm of prostate: Secondary | ICD-10-CM | POA: Diagnosis not present

## 2017-12-29 DIAGNOSIS — E1142 Type 2 diabetes mellitus with diabetic polyneuropathy: Secondary | ICD-10-CM | POA: Diagnosis not present

## 2018-01-12 DIAGNOSIS — E291 Testicular hypofunction: Secondary | ICD-10-CM | POA: Diagnosis not present

## 2018-01-27 DIAGNOSIS — D510 Vitamin B12 deficiency anemia due to intrinsic factor deficiency: Secondary | ICD-10-CM | POA: Diagnosis not present

## 2018-01-27 DIAGNOSIS — E291 Testicular hypofunction: Secondary | ICD-10-CM | POA: Diagnosis not present

## 2018-02-10 DIAGNOSIS — E291 Testicular hypofunction: Secondary | ICD-10-CM | POA: Diagnosis not present

## 2018-02-28 DIAGNOSIS — D510 Vitamin B12 deficiency anemia due to intrinsic factor deficiency: Secondary | ICD-10-CM | POA: Diagnosis not present

## 2018-02-28 DIAGNOSIS — E291 Testicular hypofunction: Secondary | ICD-10-CM | POA: Diagnosis not present

## 2018-03-14 DIAGNOSIS — E291 Testicular hypofunction: Secondary | ICD-10-CM | POA: Diagnosis not present

## 2018-03-25 DIAGNOSIS — M25572 Pain in left ankle and joints of left foot: Secondary | ICD-10-CM | POA: Diagnosis not present

## 2018-03-25 DIAGNOSIS — M19072 Primary osteoarthritis, left ankle and foot: Secondary | ICD-10-CM | POA: Diagnosis not present

## 2018-03-25 DIAGNOSIS — M85872 Other specified disorders of bone density and structure, left ankle and foot: Secondary | ICD-10-CM | POA: Diagnosis not present

## 2018-03-30 DIAGNOSIS — E291 Testicular hypofunction: Secondary | ICD-10-CM | POA: Diagnosis not present

## 2018-03-30 DIAGNOSIS — D519 Vitamin B12 deficiency anemia, unspecified: Secondary | ICD-10-CM | POA: Diagnosis not present

## 2018-04-08 DIAGNOSIS — G894 Chronic pain syndrome: Secondary | ICD-10-CM | POA: Diagnosis not present

## 2018-04-08 DIAGNOSIS — I10 Essential (primary) hypertension: Secondary | ICD-10-CM | POA: Diagnosis not present

## 2018-04-08 DIAGNOSIS — E291 Testicular hypofunction: Secondary | ICD-10-CM | POA: Diagnosis not present

## 2018-04-08 DIAGNOSIS — E1142 Type 2 diabetes mellitus with diabetic polyneuropathy: Secondary | ICD-10-CM | POA: Diagnosis not present

## 2018-04-08 DIAGNOSIS — G9050 Complex regional pain syndrome I, unspecified: Secondary | ICD-10-CM | POA: Diagnosis not present

## 2018-04-08 DIAGNOSIS — Z79891 Long term (current) use of opiate analgesic: Secondary | ICD-10-CM | POA: Diagnosis not present

## 2018-04-08 DIAGNOSIS — F419 Anxiety disorder, unspecified: Secondary | ICD-10-CM | POA: Diagnosis not present

## 2018-04-08 DIAGNOSIS — D510 Vitamin B12 deficiency anemia due to intrinsic factor deficiency: Secondary | ICD-10-CM | POA: Diagnosis not present

## 2018-04-08 DIAGNOSIS — E785 Hyperlipidemia, unspecified: Secondary | ICD-10-CM | POA: Diagnosis not present

## 2018-07-12 DIAGNOSIS — D510 Vitamin B12 deficiency anemia due to intrinsic factor deficiency: Secondary | ICD-10-CM | POA: Diagnosis not present

## 2018-07-12 DIAGNOSIS — R0609 Other forms of dyspnea: Secondary | ICD-10-CM | POA: Diagnosis not present

## 2018-07-12 DIAGNOSIS — E291 Testicular hypofunction: Secondary | ICD-10-CM | POA: Diagnosis not present

## 2018-07-12 DIAGNOSIS — E785 Hyperlipidemia, unspecified: Secondary | ICD-10-CM | POA: Diagnosis not present

## 2018-07-12 DIAGNOSIS — E1142 Type 2 diabetes mellitus with diabetic polyneuropathy: Secondary | ICD-10-CM | POA: Diagnosis not present

## 2018-07-12 DIAGNOSIS — Z79891 Long term (current) use of opiate analgesic: Secondary | ICD-10-CM | POA: Diagnosis not present

## 2018-07-14 ENCOUNTER — Ambulatory Visit: Payer: Medicare HMO | Admitting: Podiatry

## 2018-07-14 DIAGNOSIS — L97522 Non-pressure chronic ulcer of other part of left foot with fat layer exposed: Secondary | ICD-10-CM

## 2018-07-14 DIAGNOSIS — L600 Ingrowing nail: Secondary | ICD-10-CM

## 2018-07-14 MED ORDER — MUPIROCIN 2 % EX OINT: 1.0000 "application " | TOPICAL_OINTMENT | Freq: Two times a day (BID) | CUTANEOUS | 2 refills | Status: DC

## 2018-07-14 MED ORDER — CEPHALEXIN 500 MG PO CAPS: 500.0000 mg | ORAL_CAPSULE | Freq: Three times a day (TID) | ORAL | 2 refills | Status: DC

## 2018-07-25 NOTE — Progress Notes (Signed)
Subjective: 68 year old male presents the office today for concerns of a wound on the bottom of his left big toe as well as on the ingrown toenail area.  He states that his daughter was telling him that there is a "puss pocket" that needs to be cut out in order to keep this from coming back.  He states the nail is ingrown putting pressure on the toe and causing a wound on the bottom of the toe.  He has not seen any pus he states.  He denies any redness or red streaks.  This is been ongoing for greater than 1 month.  He states he does cut out the toenails himself to the point where it bleeds quite a bit. Denies any systemic complaints such as fevers, chills, nausea, vomiting. No acute changes since last appointment, and no other complaints at this time.   Objective: AAO x3, NAD DP/PT pulses palpable bilaterally, CRT less than 3 seconds There is incurvation present on medial aspect left hallux toenail.  There is also a hyperkeratotic lesion with a central ulceration present on the plantar aspect of the left hallux.  Upon debridement these areas they do not communicate to each other.  There is no probing, undermining or tunneling.  There is no purulence identified today there is no fluctuation crepitation.  There is no surrounding erythema, ascending cellulitis.  There is no malodor.  No open lesions or pre-ulcerative lesions.  Overall the toenail is dystrophic, discolored and hypertrophic. No pain with calf compression, swelling, warmth, erythema  Assessment: Left hallux ingrown toenail with plantar hallux wound  Plan: -All treatment options discussed with the patient including all alternatives, risks, complications.  -Today debrided the ingrown toenail with any complications.  There is no purulence or any signs of an abscess. -I also debrided the wound on the plantar aspect the hallux utilizing a 312 with scalpel any complications.  I was unable to identify any area of pus pocket that he was getting  concerned about.  Advised him not to try to trim the wound or the toenail himself.  I want him to apply mupirocin ointment twice a day to both wounds. -Keflex -Patient encouraged to call the office with any questions, concerns, change in symptoms.   RTC 1-2 weeks or sooner if needed  Trula Slade DPM

## 2018-07-29 ENCOUNTER — Ambulatory Visit: Payer: Medicare HMO | Admitting: Podiatry

## 2018-07-29 ENCOUNTER — Encounter: Payer: Self-pay | Admitting: Podiatry

## 2018-07-29 DIAGNOSIS — L600 Ingrowing nail: Secondary | ICD-10-CM | POA: Diagnosis not present

## 2018-07-29 DIAGNOSIS — L97521 Non-pressure chronic ulcer of other part of left foot limited to breakdown of skin: Secondary | ICD-10-CM

## 2018-08-03 NOTE — Progress Notes (Signed)
Subjective: 68 year old male presents the office today for follow evaluation of toenail dystrophy with pain in the nails as well as a wound on the left big toe.  He states that he continues to trim the toenail, dead skin on the corner until it bleeds and it relieves the pressure.  He states the wound is been doing better.  Denies any drainage or pus coming to the area denies any surrounding redness or red streaks of the toenail or wound site. Denies any systemic complaints such as fevers, chills, nausea, vomiting. No acute changes since last appointment, and no other complaints at this time.   Objective: AAO x3, NAD DP/PT pulses palpable bilaterally, CRT less than 3 seconds Today extremity rated the medial borders of the bilateral hallux toenails without any complications only small minor bleeding occurred.  There is no purulence or any signs of infection.  There is also a hyperkeratotic lesion continuation on the left medial hallux.  Upon debridement there is a superficial wound present but there is no drainage or pus and there is no probing, undermining or tunneling. No open lesions or pre-ulcerative lesions.  No pain with calf compression, swelling, warmth, erythema  Assessment: 68 year old male with healing right hallux ulceration with chronic onychodystrophy bilateral hallux  Plan: -All treatment options discussed with the patient including all alternatives, risks, complications.  -He has sharp debrided bilateral hallux toenails remove the symptomatic portion of the toenails.  Discussed with him long-term removal of toenails again with, both matricectomy however I would like for the wound to completely heal the left hallux before proceeding with this. -I debrided the hyperkeratotic tissue in the wound on the left hallux without any complications or bleeding.  Continue with daily dressing changes.  Recommend antibiotic ointment to all the areas daily. -Again advised him not to try to cut his  toenails himself. -Monitor for any clinical signs or symptoms of infection and directed to call the office immediately should any occur or go to the ER. -Patient encouraged to call the office with any questions, concerns, change in symptoms.   Trula Slade DPM

## 2018-08-12 ENCOUNTER — Ambulatory Visit: Payer: Medicare HMO | Admitting: Podiatry

## 2018-08-12 ENCOUNTER — Other Ambulatory Visit: Payer: Self-pay | Admitting: Podiatry

## 2018-10-12 DIAGNOSIS — Z79891 Long term (current) use of opiate analgesic: Secondary | ICD-10-CM | POA: Diagnosis not present

## 2018-10-12 DIAGNOSIS — E1142 Type 2 diabetes mellitus with diabetic polyneuropathy: Secondary | ICD-10-CM | POA: Diagnosis not present

## 2018-10-12 DIAGNOSIS — Z139 Encounter for screening, unspecified: Secondary | ICD-10-CM | POA: Diagnosis not present

## 2018-10-12 DIAGNOSIS — Z1331 Encounter for screening for depression: Secondary | ICD-10-CM | POA: Diagnosis not present

## 2018-10-12 DIAGNOSIS — R61 Generalized hyperhidrosis: Secondary | ICD-10-CM | POA: Diagnosis not present

## 2018-10-12 DIAGNOSIS — E785 Hyperlipidemia, unspecified: Secondary | ICD-10-CM | POA: Diagnosis not present

## 2018-10-12 DIAGNOSIS — Z1211 Encounter for screening for malignant neoplasm of colon: Secondary | ICD-10-CM | POA: Diagnosis not present

## 2018-10-12 DIAGNOSIS — Z136 Encounter for screening for cardiovascular disorders: Secondary | ICD-10-CM | POA: Diagnosis not present

## 2018-10-12 DIAGNOSIS — D510 Vitamin B12 deficiency anemia due to intrinsic factor deficiency: Secondary | ICD-10-CM | POA: Diagnosis not present

## 2018-10-12 DIAGNOSIS — E669 Obesity, unspecified: Secondary | ICD-10-CM | POA: Diagnosis not present

## 2018-10-12 DIAGNOSIS — Z0000 Encounter for general adult medical examination without abnormal findings: Secondary | ICD-10-CM | POA: Diagnosis not present

## 2018-10-12 DIAGNOSIS — E291 Testicular hypofunction: Secondary | ICD-10-CM | POA: Diagnosis not present

## 2018-10-12 DIAGNOSIS — Z125 Encounter for screening for malignant neoplasm of prostate: Secondary | ICD-10-CM | POA: Diagnosis not present

## 2019-01-12 DIAGNOSIS — G9050 Complex regional pain syndrome I, unspecified: Secondary | ICD-10-CM | POA: Diagnosis not present

## 2019-01-12 DIAGNOSIS — F419 Anxiety disorder, unspecified: Secondary | ICD-10-CM | POA: Diagnosis not present

## 2019-01-12 DIAGNOSIS — G894 Chronic pain syndrome: Secondary | ICD-10-CM | POA: Diagnosis not present

## 2019-01-12 DIAGNOSIS — E785 Hyperlipidemia, unspecified: Secondary | ICD-10-CM | POA: Diagnosis not present

## 2019-01-12 DIAGNOSIS — Z79891 Long term (current) use of opiate analgesic: Secondary | ICD-10-CM | POA: Diagnosis not present

## 2019-01-12 DIAGNOSIS — D510 Vitamin B12 deficiency anemia due to intrinsic factor deficiency: Secondary | ICD-10-CM | POA: Diagnosis not present

## 2019-01-12 DIAGNOSIS — E1142 Type 2 diabetes mellitus with diabetic polyneuropathy: Secondary | ICD-10-CM | POA: Diagnosis not present

## 2019-01-12 DIAGNOSIS — I10 Essential (primary) hypertension: Secondary | ICD-10-CM | POA: Diagnosis not present

## 2019-01-12 DIAGNOSIS — E291 Testicular hypofunction: Secondary | ICD-10-CM | POA: Diagnosis not present

## 2019-01-19 DIAGNOSIS — J208 Acute bronchitis due to other specified organisms: Secondary | ICD-10-CM | POA: Diagnosis not present

## 2019-01-19 DIAGNOSIS — D510 Vitamin B12 deficiency anemia due to intrinsic factor deficiency: Secondary | ICD-10-CM | POA: Diagnosis not present

## 2019-01-19 DIAGNOSIS — B9689 Other specified bacterial agents as the cause of diseases classified elsewhere: Secondary | ICD-10-CM | POA: Diagnosis not present

## 2019-01-19 DIAGNOSIS — J019 Acute sinusitis, unspecified: Secondary | ICD-10-CM | POA: Diagnosis not present

## 2019-04-14 DIAGNOSIS — F419 Anxiety disorder, unspecified: Secondary | ICD-10-CM | POA: Diagnosis not present

## 2019-04-14 DIAGNOSIS — E1142 Type 2 diabetes mellitus with diabetic polyneuropathy: Secondary | ICD-10-CM | POA: Diagnosis not present

## 2019-04-14 DIAGNOSIS — Z79891 Long term (current) use of opiate analgesic: Secondary | ICD-10-CM | POA: Diagnosis not present

## 2019-04-14 DIAGNOSIS — I10 Essential (primary) hypertension: Secondary | ICD-10-CM | POA: Diagnosis not present

## 2019-04-14 DIAGNOSIS — G9050 Complex regional pain syndrome I, unspecified: Secondary | ICD-10-CM | POA: Diagnosis not present

## 2019-04-14 DIAGNOSIS — D510 Vitamin B12 deficiency anemia due to intrinsic factor deficiency: Secondary | ICD-10-CM | POA: Diagnosis not present

## 2019-04-14 DIAGNOSIS — G894 Chronic pain syndrome: Secondary | ICD-10-CM | POA: Diagnosis not present

## 2019-04-14 DIAGNOSIS — E785 Hyperlipidemia, unspecified: Secondary | ICD-10-CM | POA: Diagnosis not present

## 2019-04-14 DIAGNOSIS — Z1211 Encounter for screening for malignant neoplasm of colon: Secondary | ICD-10-CM | POA: Diagnosis not present

## 2019-05-31 DIAGNOSIS — L280 Lichen simplex chronicus: Secondary | ICD-10-CM | POA: Diagnosis not present

## 2019-05-31 DIAGNOSIS — L820 Inflamed seborrheic keratosis: Secondary | ICD-10-CM | POA: Diagnosis not present

## 2019-07-21 ENCOUNTER — Emergency Department (HOSPITAL_COMMUNITY): Payer: Medicare HMO

## 2019-07-21 ENCOUNTER — Other Ambulatory Visit: Payer: Self-pay

## 2019-07-21 ENCOUNTER — Encounter (HOSPITAL_COMMUNITY): Payer: Self-pay | Admitting: Emergency Medicine

## 2019-07-21 ENCOUNTER — Observation Stay (HOSPITAL_COMMUNITY)
Admission: EM | Admit: 2019-07-21 | Discharge: 2019-07-22 | Disposition: A | Discharge status: Home / Self Care | Payer: Medicare HMO | Attending: Family Medicine | Admitting: Family Medicine

## 2019-07-21 DIAGNOSIS — C9241 Acute promyelocytic leukemia, in remission: Secondary | ICD-10-CM | POA: Insufficient documentation

## 2019-07-21 DIAGNOSIS — Z7984 Long term (current) use of oral hypoglycemic drugs: Secondary | ICD-10-CM | POA: Diagnosis not present

## 2019-07-21 DIAGNOSIS — Z9119 Patient's noncompliance with other medical treatment and regimen: Secondary | ICD-10-CM | POA: Insufficient documentation

## 2019-07-21 DIAGNOSIS — Z20822 Contact with and (suspected) exposure to covid-19: Secondary | ICD-10-CM | POA: Diagnosis not present

## 2019-07-21 DIAGNOSIS — F419 Anxiety disorder, unspecified: Secondary | ICD-10-CM | POA: Insufficient documentation

## 2019-07-21 DIAGNOSIS — R7989 Other specified abnormal findings of blood chemistry: Secondary | ICD-10-CM | POA: Diagnosis not present

## 2019-07-21 DIAGNOSIS — G2581 Restless legs syndrome: Secondary | ICD-10-CM | POA: Insufficient documentation

## 2019-07-21 DIAGNOSIS — E1165 Type 2 diabetes mellitus with hyperglycemia: Secondary | ICD-10-CM | POA: Insufficient documentation

## 2019-07-21 DIAGNOSIS — G8929 Other chronic pain: Secondary | ICD-10-CM | POA: Diagnosis not present

## 2019-07-21 DIAGNOSIS — I509 Heart failure, unspecified: Secondary | ICD-10-CM | POA: Insufficient documentation

## 2019-07-21 DIAGNOSIS — E785 Hyperlipidemia, unspecified: Secondary | ICD-10-CM | POA: Insufficient documentation

## 2019-07-21 DIAGNOSIS — IMO0002 Reserved for concepts with insufficient information to code with codable children: Secondary | ICD-10-CM

## 2019-07-21 DIAGNOSIS — M797 Fibromyalgia: Secondary | ICD-10-CM | POA: Diagnosis not present

## 2019-07-21 DIAGNOSIS — R079 Chest pain, unspecified: Secondary | ICD-10-CM | POA: Diagnosis not present

## 2019-07-21 DIAGNOSIS — R0600 Dyspnea, unspecified: Secondary | ICD-10-CM | POA: Diagnosis not present

## 2019-07-21 DIAGNOSIS — R0609 Other forms of dyspnea: Secondary | ICD-10-CM

## 2019-07-21 DIAGNOSIS — Z87891 Personal history of nicotine dependence: Secondary | ICD-10-CM | POA: Diagnosis not present

## 2019-07-21 DIAGNOSIS — F329 Major depressive disorder, single episode, unspecified: Secondary | ICD-10-CM | POA: Insufficient documentation

## 2019-07-21 DIAGNOSIS — H908 Mixed conductive and sensorineural hearing loss, unspecified: Secondary | ICD-10-CM | POA: Diagnosis not present

## 2019-07-21 DIAGNOSIS — D510 Vitamin B12 deficiency anemia due to intrinsic factor deficiency: Secondary | ICD-10-CM | POA: Diagnosis not present

## 2019-07-21 DIAGNOSIS — E1142 Type 2 diabetes mellitus with diabetic polyneuropathy: Secondary | ICD-10-CM | POA: Diagnosis not present

## 2019-07-21 DIAGNOSIS — G894 Chronic pain syndrome: Secondary | ICD-10-CM | POA: Diagnosis not present

## 2019-07-21 DIAGNOSIS — R0602 Shortness of breath: Secondary | ICD-10-CM | POA: Diagnosis present

## 2019-07-21 DIAGNOSIS — J449 Chronic obstructive pulmonary disease, unspecified: Secondary | ICD-10-CM | POA: Diagnosis not present

## 2019-07-21 DIAGNOSIS — Z79899 Other long term (current) drug therapy: Secondary | ICD-10-CM | POA: Diagnosis not present

## 2019-07-21 DIAGNOSIS — G4730 Sleep apnea, unspecified: Secondary | ICD-10-CM | POA: Insufficient documentation

## 2019-07-21 DIAGNOSIS — I10 Essential (primary) hypertension: Secondary | ICD-10-CM | POA: Diagnosis not present

## 2019-07-21 DIAGNOSIS — I110 Hypertensive heart disease with heart failure: Principal | ICD-10-CM | POA: Insufficient documentation

## 2019-07-21 DIAGNOSIS — G9050 Complex regional pain syndrome I, unspecified: Secondary | ICD-10-CM | POA: Diagnosis not present

## 2019-07-21 LAB — COMPREHENSIVE METABOLIC PANEL
ALT: 88 U/L — ABNORMAL HIGH (ref 0–44)
AST: 52 U/L — ABNORMAL HIGH (ref 15–41)
Albumin: 3.9 g/dL (ref 3.5–5.0)
Alkaline Phosphatase: 86 U/L (ref 38–126)
Anion gap: 13 (ref 5–15)
BUN: 14 mg/dL (ref 8–23)
CO2: 24 mmol/L (ref 22–32)
Calcium: 9.9 mg/dL (ref 8.9–10.3)
Chloride: 99 mmol/L (ref 98–111)
Creatinine, Ser: 1.26 mg/dL — ABNORMAL HIGH (ref 0.61–1.24)
GFR calc Af Amer: >60 mL/min (ref >60)
GFR calc non Af Amer: 59 mL/min — ABNORMAL LOW (ref >60)
Glucose, Bld: 301 mg/dL — ABNORMAL HIGH (ref 70–99)
Potassium: 3.5 mmol/L (ref 3.5–5.1)
Sodium: 136 mmol/L (ref 135–145)
Total Bilirubin: 0.7 mg/dL (ref 0.3–1.2)
Total Protein: 6.5 g/dL (ref 6.5–8.1)

## 2019-07-21 LAB — CREATININE, SERUM
Creatinine, Ser: 1.13 mg/dL (ref 0.61–1.24)
GFR calc Af Amer: >60 mL/min (ref >60)
GFR calc non Af Amer: >60 mL/min (ref >60)

## 2019-07-21 LAB — URINALYSIS, ROUTINE W REFLEX MICROSCOPIC
Bacteria, UA: NONE SEEN
Bilirubin Urine: NEGATIVE
Glucose, UA: 150 mg/dL — AB
Hgb urine dipstick: NEGATIVE
Ketones, ur: NEGATIVE mg/dL
Leukocytes,Ua: NEGATIVE
Nitrite: NEGATIVE
Protein, ur: 30 mg/dL — AB
Specific Gravity, Urine: 1.027 (ref 1.005–1.030)
pH: 5 (ref 5.0–8.0)

## 2019-07-21 LAB — CBC WITH DIFFERENTIAL/PLATELET
Abs Immature Granulocytes: 0.01 10*3/uL (ref 0.00–0.07)
Basophils Absolute: 0 10*3/uL (ref 0.0–0.1)
Basophils Relative: 0 %
Eosinophils Absolute: 0.1 10*3/uL (ref 0.0–0.5)
Eosinophils Relative: 2 %
HCT: 42 % (ref 39.0–52.0)
Hemoglobin: 14.5 g/dL (ref 13.0–17.0)
Immature Granulocytes: 0 %
Lymphocytes Relative: 23 %
Lymphs Abs: 1.1 10*3/uL (ref 0.7–4.0)
MCH: 30.8 pg (ref 26.0–34.0)
MCHC: 34.5 g/dL (ref 30.0–36.0)
MCV: 89.2 fL (ref 80.0–100.0)
Monocytes Absolute: 0.6 10*3/uL (ref 0.1–1.0)
Monocytes Relative: 12 %
Neutro Abs: 3 10*3/uL (ref 1.7–7.7)
Neutrophils Relative %: 63 %
Platelets: 165 10*3/uL (ref 150–400)
RBC: 4.71 MIL/uL (ref 4.22–5.81)
RDW: 12.2 % (ref 11.5–15.5)
WBC: 4.8 10*3/uL (ref 4.0–10.5)
nRBC: 0 % (ref 0.0–0.2)

## 2019-07-21 LAB — CBC
HCT: 40.1 % (ref 39.0–52.0)
Hemoglobin: 14.1 g/dL (ref 13.0–17.0)
MCH: 31.1 pg (ref 26.0–34.0)
MCHC: 35.2 g/dL (ref 30.0–36.0)
MCV: 88.3 fL (ref 80.0–100.0)
Platelets: 159 10*3/uL (ref 150–400)
RBC: 4.54 MIL/uL (ref 4.22–5.81)
RDW: 12.4 % (ref 11.5–15.5)
WBC: 4.8 10*3/uL (ref 4.0–10.5)
nRBC: 0 % (ref 0.0–0.2)

## 2019-07-21 LAB — MAGNESIUM: Magnesium: 1.5 mg/dL — ABNORMAL LOW (ref 1.7–2.4)

## 2019-07-21 LAB — D-DIMER, QUANTITATIVE: D-Dimer, Quant: 0.82 ug/mL-FEU — ABNORMAL HIGH (ref 0.00–0.50)

## 2019-07-21 LAB — SARS CORONAVIRUS 2 (TAT 6-24 HRS): SARS Coronavirus 2: NEGATIVE

## 2019-07-21 LAB — HEMOGLOBIN A1C
Hgb A1c MFr Bld: 11.6 % — ABNORMAL HIGH (ref 4.8–5.6)
Mean Plasma Glucose: 286.22 mg/dL

## 2019-07-21 LAB — GLUCOSE, CAPILLARY
Glucose-Capillary: 330 mg/dL — ABNORMAL HIGH (ref 70–99)
Glucose-Capillary: 313 mg/dL — ABNORMAL HIGH (ref 70–99)

## 2019-07-21 LAB — TROPONIN I (HIGH SENSITIVITY)
Troponin I (High Sensitivity): 19 ng/L — ABNORMAL HIGH (ref <18)
Troponin I (High Sensitivity): 15 ng/L (ref <18)
Troponin I (High Sensitivity): 18 ng/L — ABNORMAL HIGH (ref <18)

## 2019-07-21 LAB — TSH: TSH: 4.609 u[IU]/mL — ABNORMAL HIGH (ref 0.350–4.500)

## 2019-07-21 LAB — HIV ANTIBODY (ROUTINE TESTING W REFLEX): HIV Screen 4th Generation wRfx: NONREACTIVE

## 2019-07-21 LAB — BRAIN NATRIURETIC PEPTIDE: B Natriuretic Peptide: 166.3 pg/mL — ABNORMAL HIGH (ref 0.0–100.0)

## 2019-07-21 MED ORDER — MAGNESIUM SULFATE 2 GM/50ML IV SOLN
2.0000 g | Freq: Once | INTRAVENOUS | Status: AC
  Administered 2019-07-21: 20:00:00 2 g via INTRAVENOUS
  Filled 2019-07-21: qty 50

## 2019-07-21 MED ORDER — INSULIN ASPART 100 UNIT/ML ~~LOC~~ SOLN
4.0000 [IU] | Freq: Once | SUBCUTANEOUS | Status: AC
  Administered 2019-07-21: 4 [IU] via SUBCUTANEOUS

## 2019-07-21 MED ORDER — ATORVASTATIN CALCIUM 40 MG PO TABS
40.0000 mg | ORAL_TABLET | Freq: Every day | ORAL | Status: DC
  Administered 2019-07-21: 18:00:00 40 mg via ORAL
  Filled 2019-07-21: qty 1

## 2019-07-21 MED ORDER — ALBUTEROL SULFATE (2.5 MG/3ML) 0.083% IN NEBU: 3.0000 mL | INHALATION_SOLUTION | RESPIRATORY_TRACT | Status: DC | PRN

## 2019-07-21 MED ORDER — DOCUSATE SODIUM 100 MG PO CAPS
100.0000 mg | ORAL_CAPSULE | Freq: Two times a day (BID) | ORAL | Status: DC
  Filled 2019-07-21 (×2): qty 1

## 2019-07-21 MED ORDER — ENOXAPARIN SODIUM 40 MG/0.4ML ~~LOC~~ SOLN
40.0000 mg | SUBCUTANEOUS | Status: DC
  Administered 2019-07-21: 40 mg via SUBCUTANEOUS
  Filled 2019-07-21: qty 0.4

## 2019-07-21 MED ORDER — ACETAMINOPHEN 650 MG RE SUPP: 650.0000 mg | Freq: Four times a day (QID) | RECTAL | Status: DC | PRN

## 2019-07-21 MED ORDER — GABAPENTIN 300 MG PO CAPS
600.0000 mg | ORAL_CAPSULE | Freq: Three times a day (TID) | ORAL | Status: DC | PRN
  Administered 2019-07-21: 600 mg via ORAL
  Filled 2019-07-21: qty 2

## 2019-07-21 MED ORDER — ACETAMINOPHEN 325 MG PO TABS
650.0000 mg | ORAL_TABLET | Freq: Four times a day (QID) | ORAL | Status: DC | PRN
  Administered 2019-07-21: 23:00:00 650 mg via ORAL
  Filled 2019-07-21: qty 2

## 2019-07-21 MED ORDER — HYDROMORPHONE HCL 2 MG PO TABS
4.0000 mg | ORAL_TABLET | Freq: Four times a day (QID) | ORAL | Status: DC | PRN
  Administered 2019-07-21 – 2019-07-22 (×2): 4 mg via ORAL
  Filled 2019-07-21 (×2): qty 2

## 2019-07-21 MED ORDER — HYDROMORPHONE HCL 2 MG PO TABS
4.0000 mg | ORAL_TABLET | Freq: Once | ORAL | Status: AC
  Administered 2019-07-21: 15:00:00 4 mg via ORAL
  Filled 2019-07-21: qty 2

## 2019-07-21 MED ORDER — PANTOPRAZOLE SODIUM 40 MG PO TBEC
40.0000 mg | DELAYED_RELEASE_TABLET | Freq: Every day | ORAL | Status: DC
  Administered 2019-07-21: 40 mg via ORAL
  Filled 2019-07-21: qty 1

## 2019-07-21 MED ORDER — POLYETHYLENE GLYCOL 3350 17 G PO PACK: 17.0000 g | PACK | Freq: Every day | ORAL | Status: DC | PRN

## 2019-07-21 MED ORDER — INSULIN ASPART 100 UNIT/ML ~~LOC~~ SOLN
0.0000 [IU] | Freq: Three times a day (TID) | SUBCUTANEOUS | Status: DC
  Administered 2019-07-21: 7 [IU] via SUBCUTANEOUS
  Administered 2019-07-22: 06:00:00 5 [IU] via SUBCUTANEOUS

## 2019-07-21 MED ORDER — SENNA 8.6 MG PO TABS
1.0000 | ORAL_TABLET | Freq: Two times a day (BID) | ORAL | Status: DC
  Filled 2019-07-21 (×2): qty 1

## 2019-07-21 MED ORDER — ROPINIROLE HCL 1 MG PO TABS
4.0000 mg | ORAL_TABLET | Freq: Every evening | ORAL | Status: DC | PRN
  Administered 2019-07-21: 18:00:00 4 mg via ORAL
  Filled 2019-07-21: qty 4

## 2019-07-21 MED ORDER — ESCITALOPRAM OXALATE 20 MG PO TABS
20.0000 mg | ORAL_TABLET | Freq: Every day | ORAL | Status: DC
  Filled 2019-07-21: qty 1

## 2019-07-21 NOTE — ED Triage Notes (Signed)
Pt from PCP office  because of an ABD EKG trhat showed possible heart blcokage. Pt denies any CP.

## 2019-07-21 NOTE — Discharge Summary (Signed)
[facility] Discharge Summary  Patient name: Brent Mendoza Medical record number: [id number] Date of birth: 10-07-1950 Age: 69 y.o. Gender: male Date of Admission: 07/21/2019  Date of Discharge: 07/22/2019 Admitting Physician: Carollee Leitz, MD  Primary Care Provider: Nicoletta Dress, MD Consultants: None  Indication for Hospitalization: Shortness of Breath  Discharge Diagnoses/Problem List:  Newly diagnosed CHF Dyspnea on exertion T2DM Hypertension   Disposition: Home  Discharge Condition: Improved, stable  Discharge Exam:  BP (!) 162/75 (BP Location: Right Arm)   Pulse 63   Temp 97.8 F (36.6 C) (Oral)   Resp 16   Ht 6' (1.829 m)   Wt 111.6 kg   SpO2 96%   BMI 33.38 kg/m  General: No distress, laying in bed. Cardiovascular: Regular rate and rhythm, no murmurs auscultated, Respiratory: No work of breathing, regular rate, no cough during exam Abdomen: Obese Extremities: No dysfunction noted, can move limbs at will and moved himself around the bed fine  Brief Hospital Course:  Patient admitted to observation for subjective dyspnea on exertion.  Vital signs were normal during admission.  Echo showed 45 to 50% ejection fraction, global hypokinesis of left ventricle with mild LVH, orthostatics were negative, and amatory pulse ox showed decrease in pulse ox with ambulation to 92%.  Of note patient does have uncontrolled diabetes which he is aware of and is following with PCP, metformin is inadequate given his caloric intake but patient refused discussion of starting insulin.  We think that based off his reports of dyspnea on exertion that outpatient cardiology follow-up is appropriate.  Following the results of his echocardiogram, patient states he was ready to go home.  Patient was stable in no acute distress.  Based on ambulatory pulse ox patient did not need home oxygen.  Patient was sent home and advised to follow-up with his PCP and further  work-up of his CHF to be done by cardiologist.  Issues for Follow Up:  1. Newly diagnosed heart failure-patient will need cardiology referral.  Seems his wife already sees a cardiologist and patient open to going to that clinic. 2. DM uncontrolled and upset stomach on BID metformin.  Will likely need additional medication given his persistent hyperglycemia and would potentially benefit from transition to long acting metformin which shows improved tolerance.  May benefit from addition of SGLT2 given heart failure diagnosis. 3. Hypertension-blood pressure elevated during encounter.  Patient stated he was not currently on medication.  Seems he was previously on hydrochlorothiazide.  May benefit from switch to ACE or ARB given heart failure. 4. Patient states he has been diagnosed with sleep apnea and was previously on a BiPAP but was noncompliant for several years.  Encourage patient to continue to use BiPAP/CPAP and consider additional sleep study depending on how long ago previous diagnosis occurred. 5. TSH slightly elevated, T4 was normal range 6. Patient is on a significant amount of narcotic medicine with a benzo, our concern for the risk of this was discussed with him  Significant Procedures: None  Significant Labs and Imaging:  Recent Labs  Lab 07/21/19 1017 07/21/19 1730 07/22/19 0951  WBC 4.8 4.8 3.4*  HGB 14.5 14.1 13.2  HCT 42.0 40.1 37.6*  PLT 165 159 143*   Recent Labs  Lab 07/21/19 1017 07/21/19 1730 07/22/19 0951  NA 136  --  135  K 3.5  --  3.7  CL 99  --  100  CO2 24  --  27  GLUCOSE 301*  --  326*  BUN 14  --  16  CREATININE 1.26* 1.13 1.20  CALCIUM 9.9  --  9.5  MG  --  1.5* 2.0  ALKPHOS 86  --  79  AST 52*  --  50*  ALT 88*  --  80*  ALBUMIN 3.9  --  3.5   A1c 11.6 TSH 4.609 Free T4 1.09 Direct LDL 75.5 HIV nonreactive   Echocardiogram:  1. Left ventricular ejection fraction, by visual estimation, is 45-50%. The left ventricle has mildly decreased  global systolic function. There is mildly increased left ventricular hypertrophy of the basal septum. 2. The left ventricle demonstrates global hypokinesis. 3. Left ventricular diastolic parameters are consistent with Grade I diastolic dysfunction (impaired relaxation). 4. Global right ventricle has normal systolic function.The right ventricular size is normal. No increase in right ventricular wall thickness. 5. Left atrial size was normal. 6. Right atrial size was normal. 7. The mitral valve is normal in structure. Trivial mitral valve regurgitation. No evidence of mitral stenosis 8. The tricuspid valve is normal in structure. Tricuspid valve regurgitation is not demonstrated. 9. The aortic valve is tricuspid. Aortic valve regurgitation is not visualized. Mild to moderate aortic valve sclerosis/calcification without any evidence of aortic stenosis. 10. The pulmonic valve was normal in structure. Pulmonic valve regurgitation is trivial. 11. The inferior vena cava is normal in size with greater than 50% respiratory variability, suggesting right atrial pressure of 3 mmHg.  Results/Tests Pending at Time of Discharge: None  Discharge Medications:  Allergies as of 07/22/2019      Reactions   Promethazine    Other reaction(s): Cardiovascular Arrest (ALLERGY/intolerance) CODED IN PAST DUE TO ADMINISTRATION   Adhesive [tape]    Codeine    Other reaction(s): Other (See Comments) Vomiting and headaches      Medication List    STOP taking these medications   cephALEXin 500 MG capsule Commonly known as: KEFLEX   ibuprofen 800 MG tablet Commonly known as: ADVIL   mupirocin ointment 2 % Commonly known as: BACTROBAN     TAKE these medications   atorvastatin 40 MG tablet Commonly known as: LIPITOR Take 40 mg by mouth daily at 6 PM.   escitalopram 20 MG tablet Commonly known as: LEXAPRO   Fish Oil 1200 MG Caps Take 1 capsule (1,200 mg total) by mouth daily. What changed: how much  to take   gabapentin 400 MG capsule Commonly known as: NEURONTIN Take 1,200 mg by mouth 3 (three) times daily as needed (pain).   HYDROmorphone 4 MG tablet Commonly known as: DILAUDID Take 4 mg by mouth every 6 (six) hours as needed for severe pain.   LORazepam 2 MG tablet Commonly known as: ATIVAN Take 2 mg by mouth at bedtime as needed for anxiety or sleep.   metFORMIN 500 MG tablet Commonly known as: GLUCOPHAGE Take 1,000 mg by mouth 2 (two) times daily with a meal.   NexIUM 20 MG capsule Generic drug: esomeprazole Take 20 mg by mouth daily.   rOPINIRole 2 MG tablet Commonly known as: REQUIP Take 4 mg by mouth at bedtime as needed. Restless leg       Discharge Instructions: Please refer to Patient Instructions section of EMR for full details.  Patient was counseled important signs and symptoms that should prompt return to medical care, changes in medications, dietary instructions, activity restrictions, and follow up appointments.   Follow-Up Appointments: Follow-up Information    Nicoletta Dress, MD Follow up.   Specialty: Internal Medicine  Why: schedule a follow up appointment in the next week.   Contact information: [place] [address] [phone]        [facility] Follow up.   Specialty: Cardiology Why: this is our heart failure clinic at [facility].  If you do not already have a cardiologist you would like to see or if your pcp does not refer you to one you can contact them for an appointment.  Contact information: [address] [id number] m[address] [place]          Benay Pike, MD 07/22/2019, 1:49 PM PGY-2, [place]

## 2019-07-21 NOTE — ED Provider Notes (Signed)
[facility] EMERGENCY DEPARTMENT Provider Note   CSN: [id number] Arrival date & time: 07/21/19  [id number]     History No chief complaint on file.   Brent Mendoza is a 69 y.o. male.  HPI Patient presents to the emergency department with exertional shortness of breath ongoing for the last 6 months.  The patient states that he had an appoint with his doctor today to further assess his issue plus his regular visit.  The patient states that several months back it stopped taking his Metformin and then noticed recently that his blood sugars were reading high.  Patient states that he felt like his blood sugars were under good control he did not need the Metformin.  Patient states that yesterday he slept over the last 36 hours.  The patient states that he gets diaphoretic at rest on a fairly constant basis.  Patient states he was seen by his doctor today who sent him into the ER for further evaluation and care.  Patient states that his blood pressure was low at the doctor's office today.  The patient states that he normally is elevated blood pressures.  The patient denies chest pain,  headache,blurred vision, neck pain, fever, cough, weakness, numbness, dizziness, anorexia, edema, abdominal pain, nausea, vomiting, diarrhea, rash, back pain, dysuria, hematemesis, bloody stool, near syncope, or syncope.  Patient Active Problem List   Diagnosis Date Noted  . Depression 02/28/2013  . Chronic otitis media 10/23/2011  . Eustachian tube dysfunction 10/23/2011  . Mixed hearing loss 10/23/2011  . Erythrocytosis 07/14/2011  . Ear cysts 03/31/2011  . APL (acute promyelocytic leukemia) in remission ([place]) 04/15/2002    No past surgical history on file.     No family history on file.  Social History   Tobacco Use  . Smoking status: Unknown If Ever Smoked  . Smokeless tobacco: Never Used  Substance Use Topics  . Alcohol use: No  . Drug use: Not on file    Home Medications Prior to  Admission medications   Medication Sig Start Date End Date Taking? Authorizing Provider  atorvastatin (LIPITOR) 40 MG tablet  09/28/16   [provider]  cephALEXin (KEFLEX) 500 MG capsule TAKE 1 CAPSULE BY MOUTH 3 TIMES DAILY 08/16/18   Trula Slade, DPM  escitalopram (LEXAPRO) 20 MG tablet  09/28/16   [provider]  Esomeprazole Magnesium (NEXIUM PO)  07/21/16   [provider]  gabapentin (NEURONTIN) 400 MG capsule  07/24/16   [provider]  hydrochlorothiazide (HYDRODIURIL) 25 MG tablet  09/30/16   [provider]  HYDROmorphone (DILAUDID) 4 MG tablet  10/05/16   [provider]  ibuprofen (ADVIL,MOTRIN) 800 MG tablet  09/28/16   [provider]  LORazepam (ATIVAN) 2 MG tablet  09/30/16   [provider]  metFORMIN (GLUCOPHAGE) 500 MG tablet  09/28/16   [provider]  mupirocin ointment (BACTROBAN) 2 % Apply 1 application topically 2 (two) times daily. 07/14/18   Trula Slade, DPM  rOPINIRole (REQUIP) 4 MG tablet  09/28/16   [provider]    Allergies    Promethazine, Adhesive [tape], and Codeine  Review of Systems   Review of Systems All other systems negative except as documented in the HPI. All pertinent positives and negatives as reviewed in the HPI. Physical Exam Updated Vital Signs BP 129/77 (BP Location: Right Arm)   Pulse 67   Temp 98.3 F (36.8 C) (Oral)   Resp 16   SpO2  98%   Physical Exam Vitals and nursing note reviewed.  Constitutional:      General: He is not in acute distress.    Appearance: He is well-developed. He is diaphoretic.  HENT:     Head: Normocephalic and atraumatic.  Eyes:     Pupils: Pupils are equal, round, and reactive to light.  Cardiovascular:     Rate and Rhythm: Normal rate and regular rhythm.     Heart sounds: Normal heart sounds. No murmur. No friction rub. No gallop.   Pulmonary:     Effort: Pulmonary effort is normal. No respiratory  distress.     Breath sounds: Normal breath sounds. No wheezing.  Abdominal:     General: Bowel sounds are normal. There is no distension.     Palpations: Abdomen is soft.     Tenderness: There is no abdominal tenderness.  Musculoskeletal:     Cervical back: Normal range of motion and neck supple.     Right lower leg: Edema present.     Left lower leg: Edema present.     Comments: Patient has trace edema in his lower extremities.  Skin:    General: Skin is warm.     Capillary Refill: Capillary refill takes less than 2 seconds.     Findings: No erythema or rash.  Neurological:     Mental Status: He is alert and oriented to person, place, and time.     Motor: No abnormal muscle tone.     Coordination: Coordination normal.  Psychiatric:        Behavior: Behavior normal.     ED Results / Procedures / Treatments   Labs (all labs ordered are listed, but only abnormal results are displayed) Labs Reviewed - No data to display  EKG None  Radiology No results found.  Procedures Procedures (including critical care time)  Medications Ordered in ED Medications - No data to display  ED Course  I have reviewed the triage vital signs and the nursing notes.  Pertinent labs & imaging results that were available during my care of the patient were reviewed by me and considered in my medical decision making (see chart for details).    MDM Rules/Calculators/A&P                      The patient will have further evaluation with laboratory testing and x-rays here in the emergency department.  Patient is thus far stable and all questions have been answered for him.   Patient will be admitted to the hospital for further evaluation and care of his exertional shortness of breath.  The concern is that this could be a cardiac etiology.  I feel that the patient has had worsening ACS during the timeframe that his symptoms have started.  Patient is advised of plan and all questions were  answered. Final Clinical Impression(s) / ED Diagnoses Final diagnoses:  None    Rx / DC Orders ED Discharge Orders    None       Dalia Heading, PA-C 07/25/19 ID:[id number]    Pattricia Boss, MD 07/31/19 1454

## 2019-07-21 NOTE — Progress Notes (Signed)
D-dimer elevated 0.82.  Age-adjusted D-dimer 0.67.  Patient denies any chest pain, his oxygen saturation 99% on room air, RR 20 and has HR 64. Wells score of 0 and Geneva score of 1 which makes probability of PE less likely.   -Continue to monitor respiratory status.  Carollee Leitz MD
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
## 2019-07-22 ENCOUNTER — Observation Stay (HOSPITAL_BASED_OUTPATIENT_CLINIC_OR_DEPARTMENT_OTHER): Payer: Medicare HMO

## 2019-07-22 DIAGNOSIS — R0602 Shortness of breath: Secondary | ICD-10-CM | POA: Diagnosis not present

## 2019-07-22 DIAGNOSIS — R0600 Dyspnea, unspecified: Secondary | ICD-10-CM | POA: Diagnosis not present

## 2019-07-22 DIAGNOSIS — E1165 Type 2 diabetes mellitus with hyperglycemia: Secondary | ICD-10-CM | POA: Diagnosis not present

## 2019-07-22 DIAGNOSIS — R7989 Other specified abnormal findings of blood chemistry: Secondary | ICD-10-CM

## 2019-07-22 DIAGNOSIS — IMO0002 Reserved for concepts with insufficient information to code with codable children: Secondary | ICD-10-CM

## 2019-07-22 LAB — GLUCOSE, CAPILLARY
Glucose-Capillary: 283 mg/dL — ABNORMAL HIGH (ref 70–99)
Glucose-Capillary: 269 mg/dL — ABNORMAL HIGH (ref 70–99)

## 2019-07-22 LAB — CBC
HCT: 37.6 % — ABNORMAL LOW (ref 39.0–52.0)
Hemoglobin: 13.2 g/dL (ref 13.0–17.0)
MCH: 31.1 pg (ref 26.0–34.0)
MCHC: 35.1 g/dL (ref 30.0–36.0)
MCV: 88.7 fL (ref 80.0–100.0)
Platelets: 143 10*3/uL — ABNORMAL LOW (ref 150–400)
RBC: 4.24 MIL/uL (ref 4.22–5.81)
RDW: 12.2 % (ref 11.5–15.5)
WBC: 3.4 10*3/uL — ABNORMAL LOW (ref 4.0–10.5)
nRBC: 0 % (ref 0.0–0.2)

## 2019-07-22 LAB — COMPREHENSIVE METABOLIC PANEL
ALT: 80 U/L — ABNORMAL HIGH (ref 0–44)
AST: 50 U/L — ABNORMAL HIGH (ref 15–41)
Albumin: 3.5 g/dL (ref 3.5–5.0)
Alkaline Phosphatase: 79 U/L (ref 38–126)
Anion gap: 8 (ref 5–15)
BUN: 16 mg/dL (ref 8–23)
CO2: 27 mmol/L (ref 22–32)
Calcium: 9.5 mg/dL (ref 8.9–10.3)
Chloride: 100 mmol/L (ref 98–111)
Creatinine, Ser: 1.2 mg/dL (ref 0.61–1.24)
GFR calc Af Amer: >60 mL/min (ref >60)
GFR calc non Af Amer: >60 mL/min (ref >60)
Glucose, Bld: 326 mg/dL — ABNORMAL HIGH (ref 70–99)
Potassium: 3.7 mmol/L (ref 3.5–5.1)
Sodium: 135 mmol/L (ref 135–145)
Total Bilirubin: 0.6 mg/dL (ref 0.3–1.2)
Total Protein: 5.9 g/dL — ABNORMAL LOW (ref 6.5–8.1)

## 2019-07-22 LAB — T4, FREE: Free T4: 1.09 ng/dL (ref 0.61–1.12)

## 2019-07-22 LAB — ECHOCARDIOGRAM COMPLETE
Height: 72 in
Weight: 3937.6 oz

## 2019-07-22 LAB — LDL CHOLESTEROL, DIRECT: Direct LDL: 75.5 mg/dL (ref 0–99)

## 2019-07-22 LAB — MAGNESIUM: Magnesium: 2 mg/dL (ref 1.7–2.4)

## 2019-07-22 MED ORDER — INSULIN ASPART 100 UNIT/ML ~~LOC~~ SOLN
0.0000 [IU] | Freq: Three times a day (TID) | SUBCUTANEOUS | Status: DC
  Administered 2019-07-22: 11:00:00 8 [IU] via SUBCUTANEOUS

## 2019-07-22 MED ORDER — INSULIN ASPART 100 UNIT/ML ~~LOC~~ SOLN: 0.0000 [IU] | Freq: Every day | SUBCUTANEOUS | Status: DC

## 2019-07-22 MED ORDER — FISH OIL 1200 MG PO CAPS: 1.0000 | ORAL_CAPSULE | Freq: Every day | ORAL | 0 refills | Status: DC

## 2019-07-22 NOTE — Progress Notes (Signed)
  Echocardiogram 2D Echocardiogram has been performed.  Brent Mendoza 07/22/2019, 9:38 AM

## 2019-07-22 NOTE — Progress Notes (Signed)
Dr Nicki Reaper notified of 12 beat  runs of PVC's
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
## 2019-07-22 NOTE — Progress Notes (Signed)
SATURATION QUALIFICATIONS: (This note is used to comply with regulatory documentation for home oxygen)  Patient Saturations on Room Air at Rest = 96%  Patient Saturations on Room Air while Ambulating = 92-96%   Patient's oxygen saturation dropped to 92% (lowest while ambulating) x 150 feet. He walked for about 5 minutes, slow paced. Pt noted with shortness of breath the last minutes but oxygen sat remains stable.

## 2019-07-22 NOTE — Progress Notes (Signed)
Inpatient Diabetes Program Recommendations  AACE/ADA: New Consensus Statement on Inpatient Glycemic Control (2015)  Target Ranges:  Prepandial:   less than 140 mg/dL      Peak postprandial:   less than 180 mg/dL (1-2 hours)      Critically ill patients:  140 - 180 mg/dL   Lab Results  Component Value Date   GLUCAP 269 (H) 07/22/2019   HGBA1C 11.6 (H) 07/21/2019    Review of Glycemic Control Results for Brent Mendoza, Brent Mendoza (MRN [id number]) as of 07/22/2019 12:10  Ref. Range 07/21/2019 17:37 07/21/2019 22:00 07/22/2019 06:20 07/22/2019 11:12  Glucose-Capillary Latest Ref Range: 70 - 99 mg/dL 330 (H) 313 (H) 283 (H) 269 (H)   Diabetes history: DM 2 Outpatient Diabetes medications: Metformin 1000 mg bid Current orders for Inpatient glycemic control: Novolog 0-15 units tid + hs  Inpatient Diabetes Program Recommendations:     At time of d/c. Regardless of metformin with A1c being 11.6%, pt would benefit from insulin at time of d/c.  Per RN pt loves sweets and carbs.  Spoke with pt over the phone. Pt does not want to be on insulin at this time. Pt had been on metformin at one time and was having hypoglycemia below 50. Was taken off of it. Pt had checked his glucose for 3 years and his levels continued to be 120-130's and A1c was in the 6-7% range. Pt reports recently not being active for the last 1.5 years and with holidays, 5 birthday parties, 1 baby shower, and several honey buns and cakes later it had gotten out of hand. Pt also reports gaining 20 pounds.  Pt feels comfortable just being on metformin (has been on it last 2.5 weeks). Discussed diet and discussed checking glucose 1-2 times a day and differing times. Discussed when he need to contact his doctor for adjustments.   Thanks,  Tama Headings RN, MSN, BC-ADM Inpatient Diabetes Coordinator Team Pager [phone] (8a-5p)

## 2019-07-22 NOTE — Discharge Instructions (Signed)
Your shortness of breath is due to heart failure.  On your echocardiogram we were able to see that your heart is not pumping as efficiently as it should.  This is what we call heart failure.  Most of the time this is caused by longstanding high blood pressure, diabetes that is uncontrolled, but it could be other things as well.  You should follow-up with your primary care physician within the next week and the 2 of you should discuss a referral to a cardiologist.  The best things you can do now are lifestyle changes including improving your diet, exercising more, losing weight, and controlling your blood pressure and diabetes.  Your diabetes currently poorly controlled and you will need to adjust or be more compliant with the medications you are taking.  Being compliant with your CPAP machine will also help prevent this from worsening.    Heart Failure and Exercise Heart failure is a condition in which the heart does not fill or pump enough blood and oxygen to support your body and its functions. Heart failure is a long-term (chronic) condition. Living with heart failure can be challenging. However, following your health care provider's instructions about a healthy lifestyle may help improve your symptoms. This includes choosing the right exercise plan. Doing daily physical activity is important after a diagnosis of heart failure. You may have some activity restrictions, so talk to your health care provider before doing any exercises. What are the benefits of exercise? Exercise may:  Make your heart muscles stronger.  Lower your blood pressure.  Lower your cholesterol.  Help you lose weight.  Help your bones stay strong.  Improve your blood circulation.  Help your body use oxygen better. This relieves symptoms such as fatigue and shortness of breath.  Help your mental health by lowering the risk of depression and other problems.  Improve your quality of life.  Decrease your chance of  hospital admission for heart failure. What is an exercise plan? An exercise plan is a set of specific exercises and training activities. You will work with your health care provider to create the exercise plan that works for you. The plan may include:  Different types of exercises and how to do them.  Cardiac rehabilitation exercises. These are supervised programs that are designed to strengthen your heart. What are strengthening exercises? Strengthening exercises are a type of physical activity that involves using resistance to improve your muscle strength. Strengthening exercises usually have repetitive motions. These types of exercises can include:  Lifting weights.  Using weight machines.  Using resistance tubes and bands.  Using kettlebells.  Using your body weight, such as doing push-ups or squats. What are balance exercises? Balance exercises are another type of physical activity. They strengthen the muscles of the back, stomach, and pelvis (core muscles) and improve your balance. They can also lower your risk of falling. These types of exercises can include:  Standing on one leg.  Walking backward, sideways, and in a straight line.  Standing up after sitting, without using your hands.  Shifting your weight from one leg to the other.  Lifting one leg in front of you.  Doing tai chi. This is a type of exercise that uses slow movements and deep breathing. How can I increase my flexibility? Having better flexibility can keep you from falling. It can also lengthen your muscles, improve your range of motion, and help your joints. You can increase your flexibility by:  Doing tai chi.  Doing yoga.  Stretching. How much aerobic exercise should I get?  Aerobic exercises strengthen your breathing and circulation system and increase your body's use of oxygen. Examples of aerobic exercise include biking, walking, running, and swimming. Talk to your health care provider to find  out how much aerobic exercise is safe for you.  To do these exercises:  Start exercising slowly, limiting the amount of time at first. You may need to start with 5 minutes of aerobic exercise every day.  Slowly add more minutes until you can safely do at least 30 minutes of exercise at least 4 days a week. Summary  Daily physical activity is important after a diagnosis of heart failure.  Exercise can make your heart muscles stronger. It also offers other benefits that will improve your health.  Talk to your health care provider before doing any exercises. This information is not intended to replace advice given to you by your health care provider. Make sure you discuss any questions you have with your health care provider. Document Revised: 10/16/2016 Document Reviewed: 10/13/2016 Elsevier Patient Education  [place].   Heart Failure Eating Plan Heart failure, also called congestive heart failure, occurs when your heart does not pump blood well enough to meet your body's needs for oxygen-rich blood. Heart failure is a long-term (chronic) condition. Living with heart failure can be challenging. However, following your health care provider's instructions about a healthy lifestyle and working with a diet and nutrition specialist (dietitian) to choose the right foods may help to improve your symptoms. What are tips for following this plan? Reading food labels  Check food labels for the amount of sodium per serving. Choose foods that have less than 140 mg (milligrams) of sodium in each serving.  Check food labels for the number of calories per serving. This is important if you need to limit your daily calorie intake to lose weight.  Check food labels for the serving size. If you eat more than one serving, you will be eating more sodium and calories than what is listed on the label.  Look for foods that are labeled as "sodium-free," "very low sodium," or "low sodium." ? Foods  labeled as "reduced sodium" or "lightly salted" may still have more sodium than what is recommended for you. Cooking  Avoid adding salt when cooking. Ask your health care provider or dietitian before using salt substitutes.  Season food with salt-free seasonings, spices, or herbs. Check the label of seasoning mixes to make sure they do not contain salt.  Cook with heart-healthy oils, such as olive, canola, soybean, or sunflower oil.  Do not fry foods. Cook foods using low-fat methods, such as baking, boiling, grilling, and broiling.  Limit unhealthy fats when cooking by: ? Removing the skin from poultry, such as chicken. ? Removing all visible fats from meats. ? Skimming the fat off from stews, soups, and gravies before serving them. Meal planning   Limit your intake of: ? Processed, canned, or pre-packaged foods. ? Foods that are high in trans fat, such as fried foods. ? Sweets, desserts, sugary drinks, and other foods with added sugar. ? Full-fat dairy products, such as whole milk.  Eat a balanced diet that includes: ? 4-5 servings of fruit each day and 4-5 servings of vegetables each day. At each meal, try to fill half of your plate with fruits and vegetables. ? Up to 6-8 servings of whole grains each day. ? Up to 2 servings of lean meat, poultry, or fish each day.  One serving of meat is equal to 3 oz. This is about the same size as a deck of cards. ? 2 servings of low-fat dairy each day. ? Heart-healthy fats. Healthy fats called omega-3 fatty acids are found in foods such as flaxseed and cold-water fish like sardines, salmon, and mackerel.  Aim to eat 25-35 g (grams) of fiber a day. Foods that are high in fiber include apples, broccoli, carrots, beans, peas, and whole grains.  Do not add salt or condiments that contain salt (such as soy sauce) to foods before eating.  When eating at a restaurant, ask that your food be prepared with less salt or no salt, if possible.  Try to  eat 2 or more vegetarian meals each week.  Eat more home-cooked food and eat less restaurant, buffet, and fast food. General information  Do not eat more than 2,300 mg of salt (sodium) a day. The amount of sodium that is recommended for you may be lower, depending on your condition.  Maintain a healthy body weight as directed. Ask your health care provider what a healthy weight is for you. ? Check your weight every day. ? Work with your health care provider and dietitian to make a plan that is right for you to lose weight or maintain your current weight.  Limit how much fluid you drink. Ask your health care provider or dietitian how much fluid you can have each day.  Limit or avoid alcohol as told by your health care provider or dietitian. Recommended foods The items listed may not be a complete list. Talk with your dietitian about what dietary choices are best for you. Fruits All fresh, frozen, and canned fruits. Dried fruits, such as raisins, prunes, and cranberries. Vegetables All fresh vegetables. Vegetables that are frozen without sauce or added salt. Low-sodium or sodium-free canned vegetables. Grains Bread with less than 80 mg of sodium per slice. Whole-wheat pasta, quinoa, and brown rice. Oats and oatmeal. Barley. [place]. Grits and cream of wheat. Whole-grain and whole-wheat cold cereal. Meats and other protein foods Lean cuts of meat. Skinless chicken and [place]. Fish with high omega-3 fatty acids, such as salmon, sardines, and other cold-water fishes. Eggs. Dried beans, peas, and edamame. Unsalted nuts and nut butters. Dairy Low-fat or nonfat (skim) milk and dried milk. Rice milk, soy milk, and almond milk. Low-fat or nonfat yogurt. Small amounts of reduced-sodium block cheese. Low-sodium cottage cheese. Fats and oils Olive, canola, soybean, flaxseed, or sunflower oil. Avocado. Sweets and desserts Apple sauce. Granola bars. Sugar-free pudding and gelatin. Frozen fruit  bars. Seasoning and other foods Fresh and dried herbs. Lemon or lime juice. Vinegar. Low-sodium ketchup. Salt-free marinades, salad dressings, sauces, and seasonings. The items listed above may not be a complete list of foods and beverages you can eat. Contact a dietitian for more information. Foods to avoid The items listed may not be a complete list. Talk with your dietitian about what dietary choices are best for you. Fruits Fruits that are dried with sodium-containing preservatives. Vegetables Canned vegetables. Frozen vegetables with sauce or seasonings. Creamed vegetables. [place] fries. Onion rings. Pickled vegetables and sauerkraut. Grains Bread with more than 80 mg of sodium per slice. Hot or cold cereal with more than 140 mg sodium per serving. Salted pretzels and crackers. Pre-packaged breadcrumbs. Bagels, croissants, and biscuits. Meats and other protein foods Ribs and chicken wings. Bacon, ham, pepperoni, bologna, salami, and packaged luncheon meats. Hot dogs, bratwurst, and sausage. Canned meat. Smoked meat and fish. Salted  nuts and seeds. Dairy Whole milk, half-and-half, and cream. Buttermilk. Processed cheese, cheese spreads, and cheese curds. Regular cottage cheese. Feta cheese. Shredded cheese. String cheese. Fats and oils Butter, lard, shortening, ghee, and bacon fat. Canned and packaged gravies. Seasoning and other foods Onion salt, garlic salt, table salt, and sea salt. Marinades. Regular salad dressings. Relishes, pickles, and olives. Meat flavorings and tenderizers, and bouillon cubes. Horseradish, ketchup, and mustard. Worcestershire sauce. Teriyaki sauce, soy sauce (including reduced sodium). Hot sauce and Tabasco sauce. Steak sauce, fish sauce, oyster sauce, and cocktail sauce. Taco seasonings. Barbecue sauce. Tartar sauce. The items listed above may not be a complete list of foods and beverages you should avoid. Contact a dietitian for more information. Summary  A  heart failure eating plan includes changes that limit your intake of sodium and unhealthy fat, and it may help you lose weight or maintain a healthy weight. Your health care provider may also recommend limiting how much fluid you drink.  Most people with heart failure should eat no more than 2,300 mg of salt (sodium) a day. The amount of sodium that is recommended for you may be lower, depending on your condition.  Contact your health care provider or dietitian before making any major changes to your diet. This information is not intended to replace advice given to you by your health care provider. Make sure you discuss any questions you have with your health care provider. Document Revised: 07/28/2018 Document Reviewed: 10/16/2016 Elsevier Patient Education  [place].   Heart Failure, Self Care Heart failure is a serious condition. This document explains the things you need to do to take care of yourself after a heart failure diagnosis. You may be asked to change your diet, take certain medicines, and make other lifestyle changes in order to stay as healthy as possible. Your health care provider may also give you more specific instructions. If you have problems or questions, contact your health care provider. What are the risks? Having heart failure puts you at higher risk for certain problems. These problems can get worse if you do not take good care of yourself. Problems may include:  Blood clotting problems. This may cause a stroke.  Damage to the kidneys, liver, or lungs.  Abnormal heart rhythms. Supplies needed:  Scale for monitoring weight.  Blood pressure monitor.  Notebook.  Medicines. How to care for yourself when you have heart failure Medicines Take over-the-counter and prescription medicines only as told by your health care provider. Medicines reduce the workload of your heart, slow the progression of heart failure, and improve symptoms. Take your medicines every  day.  Do not stop taking your medicine unless your health care provider tells you to do so.  Do not skip any dose of medicine.  Refill your prescriptions before you run out of medicine. Eating and drinking   Eat heart-healthy foods. Talk with a dietitian to make an eating plan that is right for you. ? Choose foods that contain no trans fat and are low in saturated fat and cholesterol. Healthy choices include fresh or frozen fruits and vegetables, fish, lean meats, legumes, fat-free or low-fat dairy products, and whole-grain or high-fiber foods. ? Limit salt (sodium) if told by your health care provider. Sodium restriction may reduce symptoms of heart failure. Ask a dietitian to recommend heart-healthy seasonings. ? Use healthy cooking methods instead of frying. Healthy methods include roasting, grilling, broiling, baking, poaching, steaming, and stir-frying.  Limit your fluid intake, if directed by  your health care provider. Fluid restriction may reduce symptoms of heart failure. Alcohol use  Do not drink alcohol if: ? Your health care provider tells you not to drink. ? Your heart was damaged by alcohol, or you have severe heart failure. ? You are pregnant, may be pregnant, or are planning to become pregnant.  If you drink alcohol: ? Limit how much you use to:  0-1 drink a day for women.  0-2 drinks a day for men. ? Be aware of how much alcohol is in your drink. In the [place] one drink equals one 12 oz bottle of beer (355 mL), one 5 oz glass of wine (148 mL), or one 1 oz glass of hard liquor (44 mL). Lifestyle   Do not use any products that contain nicotine or tobacco, such as cigarettes, e-cigarettes, and chewing tobacco. If you need help quitting, ask your health care provider. ? Do not use nicotine gum or patches before talking to your health care provider.  Do not use illegal drugs.  Work with your health care provider to safely reach the right body weight.  Do physical  activity if told by your health care provider. Talk to your health care provider before you begin an exercise if: ? You are an older adult. ? You have severe heart failure.  Learn to manage stress. If you need help to do this, ask your health care provider.  Participate in or seek rehabilitation as needed to keep or improve your independence and quality of life.  Plan rest periods when you get tired. Monitoring important information   Weigh yourself every day. This will help you to notice if too much fluid is building up in your body. ? Weigh yourself every morning after you urinate and before you eat breakfast. ? Wear the same amount of clothing each time you weigh yourself. ? Record your daily weight. Provide your health care provider with your weight record.  Monitor and record your pulse and blood pressure as told by your health care provider. Dealing with extreme temperatures  If the weather is extremely hot: ? Avoid vigorous physical activity. ? Use air conditioning or fans, or find a cooler location. ? Avoid caffeine and alcohol. ? Wear loose-fitting, lightweight, and light-colored clothing.  If the weather is extremely cold: ? Avoid vigorous activity. ? Layer your clothes. ? Wear mittens or gloves, a hat, and a scarf when you go outside. ? Avoid alcohol. Follow these instructions at home:  Stay up to date with vaccines. Pneumococcal and flu (influenza) vaccines are especially important in preventing infections of the airways.  Keep all follow-up visits as told by your health care provider. This is important. Contact a health care provider if you:  Have a rapid weight gain.  Have increasing shortness of breath.  Are unable to participate in your usual physical activities.  Get tired easily.  Cough more than normal, especially with physical activity.  Lose your appetite or feel nauseous.  Have any swelling or more swelling in areas such as your hands, feet,  ankles, or abdomen.  Are unable to sleep because it is hard to breathe.  Feel like your heart is beating quickly (palpitations).  Become dizzy or light-headed when you stand up. Get help right away if you:  Have trouble breathing.  Notice or your family notices a change in your awareness, such as having trouble staying awake or concentrating.  Have pain or discomfort in your chest.  Have an episode of fainting (  syncope). These symptoms may represent a serious problem that is an emergency. Do not wait to see if the symptoms will go away. Get medical help right away. Call your local emergency services (911 in the [place] Do not drive yourself to the hospital. Summary  Heart failure is a serious condition. To care for yourself, you may be asked to change your diet, take certain medicines, and make other lifestyle changes.  Take your medicines every day. Do not stop taking them unless your health care provider tells you to do so.  Eat heart-healthy foods, such as fresh or frozen fruits and vegetables, fish, lean meats, legumes, fat-free or low-fat dairy products, and whole-grain or high-fiber foods.  Ask your health care provider if you have any alcohol restrictions. You may have to stop drinking alcohol if you have severe heart failure.  Contact your health care provider if you notice problems, such as rapid weight gain or a fast heartbeat. Get help right away if you faint, or have chest pain or trouble breathing. This information is not intended to replace advice given to you by your health care provider. Make sure you discuss any questions you have with your health care provider. Document Revised: 09/13/2018 Document Reviewed: 09/14/2018 Elsevier Patient Education  [place].

## 2019-07-22 NOTE — Progress Notes (Signed)
Discharge instruction reviewed with patient. All questions answered at this time. Transport provided by family.

## 2019-07-23 NOTE — Progress Notes (Signed)
Cardiology Office Note:   Date:  07/24/2019  NAME:  Brent Mendoza    MRN: [id number] DOB:  [birth year]   PCP:  Nicoletta Dress, MD  Cardiologist:  Evalina Field, MD   Referring MD: Nicoletta Dress, MD   Chief Complaint  Patient presents with  . Congestive Heart Failure   History of Present Illness:   Brent Mendoza is a 69 y.o. male with a hx of diabetes, systolic heart failure, hypertension who is being seen today for the evaluation of systolic heart failure at the request of Nicoletta Dress, MD.  He was just admitted to the hospital a few days ago with shortness of breath and noted to have an elevated BNP to 166.  EKG demonstrates first-degree AV block with left bundle branch block, QRS 168 ms.  Ejection fraction was estimated around 45 to 50%.  Thyroid studies show subclinical hypothyroidism.  A1c 11.6.    He reports for the last 6 months has had worsening shortness of breath with exertion.  He reports activity such as walking in the house gets him quite short of breath and sweaty.  He also reports intermittent sweaty sensations.  He denies any exertional chest pain.  He actually gets no chest pain at all.  He also reports no orthopnea or PND.  His diabetes shows A1c 11.6.  This apparently has come upon him out of nowhere.  I am highly concerned about this.  He also has a new left bundle branch block with mildly reduced ejection fraction.  Overall this is highly concerning for underlying coronary artery disease.  He apparently was admitted to the hospital for 2 days and they did nothing.  They prescribed no medications and did little work-up.  He reports he has been reluctant to do insulin in the past.  He reports that his blood sugars have been under better control.  He does report depression anxiety.  He presents with his wife today.  Apparently their son died 1-1/2 years ago.  This is been difficult for him.  He reports 2-3 episodes of crying spells that happen weekly.  CVD risk  factors include former tobacco abuse, severely uncontrolled diabetes, hypertension, obesity.  Problem List 1. Systolic HF -EF Q000111Q, BNP 166 -TSH 4.6, T4 1.09 2. DM -A1c 11.6, LDL 75 3. HTN  Past Medical History: Past Medical History:  Diagnosis Date  . CHF (congestive heart failure) ([place])   . Diabetes mellitus without complication ([place])   . Hyperlipidemia   . Hypertension     Past Surgical History: Past Surgical History:  Procedure Laterality Date  . LEG SURGERY      Current Medications: Current Meds  Medication Sig  . atorvastatin (LIPITOR) 40 MG tablet Take 40 mg by mouth daily at 6 PM.   . escitalopram (LEXAPRO) 20 MG tablet   . esomeprazole (NEXIUM) 20 MG capsule Take 20 mg by mouth daily.   Marland Kitchen gabapentin (NEURONTIN) 400 MG capsule Take 1,200 mg by mouth 3 (three) times daily as needed (pain).   Marland Kitchen HYDROmorphone (DILAUDID) 4 MG tablet Take 4 mg by mouth every 6 (six) hours as needed for severe pain.   Marland Kitchen LORazepam (ATIVAN) 2 MG tablet Take 2 mg by mouth at bedtime as needed for anxiety or sleep.   . metFORMIN (GLUCOPHAGE) 500 MG tablet Take 1,000 mg by mouth 2 (two) times daily with a meal.   . Omega-3 Fatty Acids (FISH OIL) 1200 MG CAPS Take 1 capsule (1,200 mg  total) by mouth daily.  Marland Kitchen rOPINIRole (REQUIP) 2 MG tablet Take 4 mg by mouth at bedtime as needed. Restless leg     Allergies:    Promethazine, Adhesive [tape], and Codeine   Social History: Social History   Socioeconomic History  . Marital status: Married    Spouse name: Not on file  . Number of children: Not on file  . Years of education: Not on file  . Highest education level: Not on file  Occupational History  . Occupation: truck Geophysicist/field seismologist  Tobacco Use  . Smoking status: Former Smoker    Years: 13.00  . Smokeless tobacco: Never Used  Substance and Sexual Activity  . Alcohol use: No  . Drug use: Never  . Sexual activity: Not on file  Other Topics Concern  . Not on file  Social History  Narrative  . Not on file   Social Determinants of Health   Financial Resource Strain:   . Difficulty of Paying Living Expenses: Not on file  Food Insecurity:   . Worried About Charity fundraiser in the Last Year: Not on file  . Ran Out of Food in the Last Year: Not on file  Transportation Needs:   . Lack of Transportation (Medical): Not on file  . Lack of Transportation (Non-Medical): Not on file  Physical Activity:   . Days of Exercise per Week: Not on file  . Minutes of Exercise per Session: Not on file  Stress:   . Feeling of Stress : Not on file  Social Connections:   . Frequency of Communication with Friends and Family: Not on file  . Frequency of Social Gatherings with Friends and Family: Not on file  . Attends Religious Services: Not on file  . Active Member of Clubs or Organizations: Not on file  . Attends Archivist Meetings: Not on file  . Marital Status: Not on file     Family History: The patient's family history includes Heart disease in his father and mother; Stroke in his brother.  ROS:   All other ROS reviewed and negative. Pertinent positives noted in the HPI.     EKGs/Labs/Other Studies Reviewed:   The following studies were personally reviewed by me today:  EKG:  EKG is ordered today.  The ekg ordered today demonstrates normal sinus rhythm with first-degree AV block, 222 ms, left bundle branch block, QRS noted 162 ms, and was personally reviewed by me.   TTE 07/22/2019 1. Left ventricular ejection fraction, by visual estimation, is 45-50%.  The left ventricle has mildly decreased global systolic function. There is  mildly increased left ventricular hypertrophy of the basal septum.  2. The left ventricle demonstrates global hypokinesis.  3. Left ventricular diastolic parameters are consistent with Grade I  diastolic dysfunction (impaired relaxation).  4. Global right ventricle has normal systolic function.The right  ventricular size is  normal. No increase in right ventricular wall  thickness.  5. Left atrial size was normal.  6. Right atrial size was normal.  7. The mitral valve is normal in structure. Trivial mitral valve  regurgitation. No evidence of mitral stenosis  8. The tricuspid valve is normal in structure. Tricuspid valve  regurgitation is not demonstrated.  9. The aortic valve is tricuspid. Aortic valve regurgitation is not  visualized. Mild to moderate aortic valve sclerosis/calcification without  any evidence of aortic stenosis.  10. The pulmonic valve was normal in structure. Pulmonic valve  regurgitation is trivial.  11. The inferior vena  cava is normal in size with greater than 50%  respiratory variability, suggesting right atrial pressure of 3 mmHg.   Recent Labs: 07/21/2019: B Natriuretic Peptide 166.3; TSH 4.609 07/22/2019: ALT 80; BUN 16; Creatinine, Ser 1.20; Hemoglobin 13.2; Magnesium 2.0; Platelets 143; Potassium 3.7; Sodium 135   Recent Lipid Panel    Component Value Date/Time   LDLDIRECT 75.5 07/22/2019 0951    Physical Exam:   VS:  BP 122/72   Pulse 67   Temp (!) 96.4 F (35.8 C)   Ht 6' (1.829 m)   Wt 246 lb 3.2 oz (111.7 kg)   SpO2 97%   BMI 33.39 kg/m    Wt Readings from Last 3 Encounters:  07/24/19 246 lb 3.2 oz (111.7 kg)  07/22/19 246 lb 1.6 oz (111.6 kg)    General: Well nourished, well developed, in no acute distress Heart: Atraumatic, normal size  Eyes: PEERLA, EOMI  Neck: Supple, no JVD Endocrine: No thryomegaly Cardiac: Normal S1, S2; RRR; no murmurs, rubs, or gallops Lungs: Clear to auscultation bilaterally, no wheezing, rhonchi or rales  Abd: Soft, nontender, no hepatomegaly  Ext: No edema, pulses 2+ Musculoskeletal: No deformities, BUE and BLE strength normal and equal Skin: Warm and dry, no rashes   Neuro: Alert and oriented to person, place, time, and situation, CNII-XII grossly intact, no focal deficits  Psych: Normal mood and affect   ASSESSMENT:     Brent Mendoza is a 69 y.o. male who presents for the following: 1. Chronic systolic heart failure ([place])   2. SOB (shortness of breath)   3. Essential hypertension   4. LBBB (left bundle branch block)   5. Uncontrolled type 2 diabetes mellitus with hyperglycemia ([place])     PLAN:   1. Chronic systolic heart failure (HCC), EF 45-50%, LBBB QRS 162 ms, NYHA Class II, ACC/AHA Stage C 2. SOB (shortness of breath) -Recently admitted for shortness of breath and found to have EF 45 to 50%.  He has a left bundle branch block with QRS 162 ms.  Presumptively this is new. -Troponins were negative in the hospital. -BNP was mildly elevated at 166. -He has no evidence of volume overload today on my examination.  I suspect that his significant hyperglycemia is actually keeping him euvolemic.  He reports frequent urination and this is likely related to his severely high glucose values. -Overall I am highly concerned for an ischemic etiology.  He had no regional wall motion abnormalities on echo but given the new left bundle he could have significant three-vessel disease. -We will proceed with a Lexiscan nuclear medicine stress test.  It is prudent to obtain a pharmacologic stress test in the setting of left bundle branch block. -He does have significant QRS prolongation, 162 ms.  No significant drop in EF.  We will need to keep an eye on this. -I will also start him on carvedilol 3.125 mg twice daily and losartan 25 mg daily -Kidney function was noted to be creatinine 1.2.  We will obtain a BMP when he obtains his Lexi nuclear medicine stress test  3. Essential hypertension -A bit elevated today -Coreg and losartan  4. LBBB (left bundle branch block) -New -Stress test as above  5. Uncontrolled type 2 diabetes mellitus with hyperglycemia (HCC) -A1c 11.6 -Likely will need insulin.  We will follow up with primary care physician later this week about this.   Disposition: Return in about 1 month (around  08/21/2019).  Medication Adjustments/Labs and Tests Ordered: Current medicines  are reviewed at length with the patient today.  Concerns regarding medicines are outlined above.  Orders Placed This Encounter  Procedures  . Basic metabolic panel  . MYOCARDIAL PERFUSION IMAGING  . EKG 12-Lead   Meds ordered this encounter  Medications  . carvedilol (COREG) 3.125 MG tablet    Sig: Take 1 tablet (3.125 mg total) by mouth 2 (two) times daily with a meal.    Dispense:  180 tablet    Refill:  3  . losartan (COZAAR) 25 MG tablet    Sig: Take 1 tablet (25 mg total) by mouth daily.    Dispense:  90 tablet    Refill:  3    Patient Instructions  Medication Instructions:  Start Carvedilol 3.125 mg twice daily Start Losartan 25 mg daily  *If you need a refill on your cardiac medications before your next appointment, please call your pharmacy*  Lab Work: BMP the day of the [place] If you have labs (blood work) drawn today and your tests are completely normal, you will receive your results only by: Marland Kitchen MyChart Message (if you have MyChart) OR . A paper copy in the mail If you have any lab test that is abnormal or we need to change your treatment, we will call you to review the results.  Testing/Procedures: Your physician has requested that you have a lexiscan myoview. A cardiac stress test is a cardiological test that measures the heart's ability to respond to external stress in a controlled clinical environment. The stress response is induced by intravenous pharmacological stimulation.    Follow-Up: At [facility], you and your health needs are our priority.  As part of our continuing mission to provide you with exceptional heart care, we have created designated Provider Care Teams.  These Care Teams include your primary Cardiologist (physician) and Advanced Practice Providers (APPs -  Physician Assistants and Nurse Practitioners) who all work together to provide you with the care you need, when  you need it.  Your next appointment:   1 month(s)  The format for your next appointment:   In Person  Provider:   Eleonore Chiquito, MD        Signed, Addison Naegeli. Audie Box, [place]  [address] [place], [place] [address] [phone]  07/24/2019 5:20 PM

## 2019-07-24 ENCOUNTER — Ambulatory Visit: Payer: Medicare HMO | Admitting: Cardiovascular Disease

## 2019-07-24 ENCOUNTER — Encounter: Payer: Self-pay | Admitting: Cardiovascular Disease

## 2019-07-24 ENCOUNTER — Other Ambulatory Visit: Payer: Self-pay

## 2019-07-24 VITALS — BP 122/72 | HR 67 | Temp 96.4°F | Ht 72.0 in | Wt 246.2 lb

## 2019-07-24 DIAGNOSIS — I10 Essential (primary) hypertension: Secondary | ICD-10-CM | POA: Diagnosis not present

## 2019-07-24 DIAGNOSIS — R0602 Shortness of breath: Secondary | ICD-10-CM | POA: Diagnosis not present

## 2019-07-24 DIAGNOSIS — E1165 Type 2 diabetes mellitus with hyperglycemia: Secondary | ICD-10-CM

## 2019-07-24 DIAGNOSIS — I447 Left bundle-branch block, unspecified: Secondary | ICD-10-CM | POA: Diagnosis not present

## 2019-07-24 DIAGNOSIS — I5022 Chronic systolic (congestive) heart failure: Secondary | ICD-10-CM | POA: Diagnosis not present

## 2019-07-24 MED ORDER — LOSARTAN POTASSIUM 25 MG PO TABS: 25.0000 mg | ORAL_TABLET | Freq: Every day | ORAL | 3 refills | Status: DC

## 2019-07-24 MED ORDER — CARVEDILOL 3.125 MG PO TABS: 3.1250 mg | ORAL_TABLET | Freq: Two times a day (BID) | ORAL | 3 refills | Status: DC

## 2019-07-24 NOTE — Patient Instructions (Signed)
Medication Instructions:  Start Carvedilol 3.125 mg twice daily Start Losartan 25 mg daily  *If you need a refill on your cardiac medications before your next appointment, please call your pharmacy*  Lab Work: BMP the day of the [place] If you have labs (blood work) drawn today and your tests are completely normal, you will receive your results only by: Marland Kitchen MyChart Message (if you have MyChart) OR . A paper copy in the mail If you have any lab test that is abnormal or we need to change your treatment, we will call you to review the results.  Testing/Procedures: Your physician has requested that you have a lexiscan myoview. A cardiac stress test is a cardiological test that measures the heart's ability to respond to external stress in a controlled clinical environment. The stress response is induced by intravenous pharmacological stimulation.    Follow-Up: At [facility], you and your health needs are our priority.  As part of our continuing mission to provide you with exceptional heart care, we have created designated Provider Care Teams.  These Care Teams include your primary Cardiologist (physician) and Advanced Practice Providers (APPs -  Physician Assistants and Nurse Practitioners) who all work together to provide you with the care you need, when you need it.  Your next appointment:   1 month(s)  The format for your next appointment:   In Person  Provider:   Eleonore Chiquito, MD

## 2019-07-28 DIAGNOSIS — I10 Essential (primary) hypertension: Secondary | ICD-10-CM | POA: Diagnosis not present

## 2019-07-28 DIAGNOSIS — E1142 Type 2 diabetes mellitus with diabetic polyneuropathy: Secondary | ICD-10-CM | POA: Diagnosis not present

## 2019-07-28 DIAGNOSIS — I447 Left bundle-branch block, unspecified: Secondary | ICD-10-CM | POA: Diagnosis not present

## 2019-07-28 DIAGNOSIS — I5022 Chronic systolic (congestive) heart failure: Secondary | ICD-10-CM | POA: Diagnosis not present

## 2019-08-08 ENCOUNTER — Telehealth: Payer: Self-pay | Admitting: Cardiovascular Disease

## 2019-08-08 ENCOUNTER — Telehealth (HOSPITAL_COMMUNITY): Payer: Self-pay

## 2019-08-08 ENCOUNTER — Encounter: Payer: Self-pay | Admitting: Cardiovascular Disease

## 2019-08-08 NOTE — Telephone Encounter (Signed)
Called to schedule 1 month follow up with Dr. Audie Box for [facility] results---pt does not have voice mail---will mail iinformation to patient.

## 2019-08-08 NOTE — Telephone Encounter (Signed)
Encounter complete. 

## 2019-08-10 ENCOUNTER — Other Ambulatory Visit: Payer: Self-pay

## 2019-08-10 ENCOUNTER — Ambulatory Visit (HOSPITAL_COMMUNITY)
Admission: RE | Admit: 2019-08-10 | Discharge: 2019-08-10 | Disposition: A | Discharge status: Home / Self Care | Payer: Medicare HMO | Source: Ambulatory Visit | Attending: Cardiology | Admitting: Cardiology

## 2019-08-10 DIAGNOSIS — R0602 Shortness of breath: Secondary | ICD-10-CM | POA: Insufficient documentation

## 2019-08-10 LAB — BASIC METABOLIC PANEL
BUN/Creatinine Ratio: 15 (ref 10–24)
BUN: 18 mg/dL (ref 8–27)
CO2: 24 mmol/L (ref 20–29)
Calcium: 10.4 mg/dL — ABNORMAL HIGH (ref 8.6–10.2)
Chloride: 98 mmol/L (ref 96–106)
Creatinine, Ser: 1.2 mg/dL (ref 0.76–1.27)
GFR calc Af Amer: 72 mL/min/{1.73_m2} (ref >59)
GFR calc non Af Amer: 62 mL/min/{1.73_m2} (ref >59)
Glucose: 153 mg/dL — ABNORMAL HIGH (ref 65–99)
Potassium: 4.2 mmol/L (ref 3.5–5.2)
Sodium: 138 mmol/L (ref 134–144)

## 2019-08-10 LAB — MYOCARDIAL PERFUSION IMAGING
LV dias vol: 184 mL (ref 62–150)
LV sys vol: 106 mL
Peak HR: 76 {beats}/min
Rest HR: 55 {beats}/min
SDS: 0
SRS: 0
SSS: 0
TID: 1.05

## 2019-08-10 MED ORDER — TECHNETIUM TC 99M TETROFOSMIN IV KIT
31.9000 | PACK | Freq: Once | INTRAVENOUS | Status: AC | PRN
  Administered 2019-08-10: 31.9 via INTRAVENOUS
  Filled 2019-08-10: qty 32

## 2019-08-10 MED ORDER — TECHNETIUM TC 99M TETROFOSMIN IV KIT
10.6000 | PACK | Freq: Once | INTRAVENOUS | Status: AC | PRN
  Administered 2019-08-10: 10:00:00 10.6 via INTRAVENOUS
  Filled 2019-08-10: qty 11

## 2019-08-10 MED ORDER — REGADENOSON 0.4 MG/5ML IV SOLN
0.4000 mg | Freq: Once | INTRAVENOUS | Status: AC
  Administered 2019-08-10: 0.4 mg via INTRAVENOUS

## 2019-08-20 NOTE — Progress Notes (Signed)
Cardiology Office Note:   Date:  08/21/2019  NAME:  Brent Mendoza    MRN: [id number] DOB:  10/03/1950   PCP:  Nicoletta Dress, MD  Cardiologist:  Evalina Field, MD   Referring MD: Nicoletta Dress, MD   Chief Complaint  Patient presents with  . Congestive Heart Failure   History of Present Illness:   Brent Mendoza is a 69 y.o. male with a hx of DM, HTN, systolic HF, LBBB who presents for follow-up of heart failure.  Most recent cardiac stress test was normal.  He reports he is started to be a little more active.  He does get a little dizzy and lightheaded with heavy exertion.  Symptoms resolve rather quickly.  He has had no passing out spells.  He denies any chest pain or shortness of breath today.  Reports no palpitations either.  He is lost about 10 pounds since we saw each other last.  Seems to be feeling a little bit better.  He is working remarkably on his diabetes.  He reports his sugars are 140 in the morning.  He will have his A1c checked later this week.  His most recent lipid profile shows an LDL cholesterol of 75.  He is only on Lipitor 40 mg daily.  Primary care physician with plans to recheck cholesterol seen.  Problem List 1. Systolic HF -EF Q000111Q, BNP 166 -TSH 4.6, T4 1.09 -LBBB QRS 168 ms 2. DM -A1c 11.6, LDL 75 3. HTN  Past Medical History: Past Medical History:  Diagnosis Date  . CHF (congestive heart failure) ([place])   . Diabetes mellitus without complication ([place])   . Hyperlipidemia   . Hypertension     Past Surgical History: Past Surgical History:  Procedure Laterality Date  . LEG SURGERY      Current Medications: Current Meds  Medication Sig  . atorvastatin (LIPITOR) 40 MG tablet Take 40 mg by mouth daily at 6 PM.   . carvedilol (COREG) 3.125 MG tablet Take 1 tablet (3.125 mg total) by mouth 2 (two) times daily with a meal.  . escitalopram (LEXAPRO) 20 MG tablet   . esomeprazole (NEXIUM) 20 MG capsule Take 20 mg by mouth daily.   Brent Kitchen  gabapentin (NEURONTIN) 400 MG capsule Take 1,200 mg by mouth 3 (three) times daily as needed (pain).   Brent Kitchen HYDROmorphone (DILAUDID) 4 MG tablet Take 4 mg by mouth every 6 (six) hours as needed for severe pain.   Brent Kitchen LORazepam (ATIVAN) 2 MG tablet Take 2 mg by mouth at bedtime as needed for anxiety or sleep.   Brent Kitchen losartan (COZAAR) 25 MG tablet Take 1 tablet (25 mg total) by mouth daily.  . metFORMIN (GLUCOPHAGE) 500 MG tablet Take 1,000 mg by mouth 2 (two) times daily with a meal.   . Omega-3 Fatty Acids (FISH OIL) 1200 MG CAPS Take 1 capsule (1,200 mg total) by mouth daily.  Brent Kitchen rOPINIRole (REQUIP) 2 MG tablet Take 4 mg by mouth at bedtime as needed. Restless leg     Allergies:    Promethazine, Adhesive [tape], and Codeine   Social History: Social History   Socioeconomic History  . Marital status: Married    Spouse name: Not on file  . Number of children: Not on file  . Years of education: Not on file  . Highest education level: Not on file  Occupational History  . Occupation: truck Geophysicist/field seismologist  Tobacco Use  . Smoking status: Former Smoker    Years:  13.00  . Smokeless tobacco: Never Used  Substance and Sexual Activity  . Alcohol use: No  . Drug use: Never  . Sexual activity: Not on file  Other Topics Concern  . Not on file  Social History Narrative  . Not on file   Social Determinants of Health   Financial Resource Strain:   . Difficulty of Paying Living Expenses: Not on file  Food Insecurity:   . Worried About Charity fundraiser in the Last Year: Not on file  . Ran Out of Food in the Last Year: Not on file  Transportation Needs:   . Lack of Transportation (Medical): Not on file  . Lack of Transportation (Non-Medical): Not on file  Physical Activity:   . Days of Exercise per Week: Not on file  . Minutes of Exercise per Session: Not on file  Stress:   . Feeling of Stress : Not on file  Social Connections:   . Frequency of Communication with Friends and Family: Not on file  .  Frequency of Social Gatherings with Friends and Family: Not on file  . Attends Religious Services: Not on file  . Active Member of Clubs or Organizations: Not on file  . Attends Archivist Meetings: Not on file  . Marital Status: Not on file     Family History: The patient's family history includes Heart disease in his father and mother; Stroke in his brother.  ROS:   All other ROS reviewed and negative. Pertinent positives noted in the HPI.     EKGs/Labs/Other Studies Reviewed:   The following studies were personally reviewed by me today:  TTE 07/22/2019 1. Left ventricular ejection fraction, by visual estimation, is 45-50%.  The left ventricle has mildly decreased global systolic function. There is  mildly increased left ventricular hypertrophy of the basal septum.  2. The left ventricle demonstrates global hypokinesis.  3. Left ventricular diastolic parameters are consistent with Grade I  diastolic dysfunction (impaired relaxation).  4. Global right ventricle has normal systolic function.The right  ventricular size is normal. No increase in right ventricular wall  thickness.  5. Left atrial size was normal.  6. Right atrial size was normal.  7. The mitral valve is normal in structure. Trivial mitral valve  regurgitation. No evidence of mitral stenosis  8. The tricuspid valve is normal in structure. Tricuspid valve  regurgitation is not demonstrated.  9. The aortic valve is tricuspid. Aortic valve regurgitation is not  visualized. Mild to moderate aortic valve sclerosis/calcification without  any evidence of aortic stenosis.  10. The pulmonic valve was normal in structure. Pulmonic valve  regurgitation is trivial.  11. The inferior vena cava is normal in size with greater than 50%  respiratory variability, suggesting right atrial pressure of 3 mmHg.   NM Stress 08/10/2019  Nuclear stress EF: 42%. The left ventricular ejection fraction is moderately  decreased (30-44%).  There was no ST segment deviation noted during stress.  This is an intermediate risk study.  The study is normal.    Recent Labs: 07/21/2019: B Natriuretic Peptide 166.3; TSH 4.609 07/22/2019: ALT 80; Hemoglobin 13.2; Magnesium 2.0; Platelets 143 08/10/2019: BUN 18; Creatinine, Ser 1.20; Potassium 4.2; Sodium 138   Recent Lipid Panel    Component Value Date/Time   LDLDIRECT 75.5 07/22/2019 0951    Physical Exam:   VS:  BP 136/84   Pulse (!) 58   Ht 6' (1.829 m)   Wt 242 lb 12.8 oz (110.1 kg)  SpO2 95%   BMI 32.93 kg/m    Wt Readings from Last 3 Encounters:  08/21/19 242 lb 12.8 oz (110.1 kg)  08/10/19 246 lb (111.6 kg)  07/24/19 246 lb 3.2 oz (111.7 kg)    General: Well nourished, well developed, in no acute distress Heart: Atraumatic, normal size  Eyes: PEERLA, EOMI  Neck: Supple, no JVD Endocrine: No thryomegaly Cardiac: Normal S1, S2; RRR; no murmurs, rubs, or gallops Lungs: Clear to auscultation bilaterally, no wheezing, rhonchi or rales  Abd: Soft, nontender, no hepatomegaly  Ext: No edema, pulses 2+ Musculoskeletal: No deformities, BUE and BLE strength normal and equal Skin: Warm and dry, no rashes   Neuro: Alert and oriented to person, place, time, and situation, CNII-XII grossly intact, no focal deficits  Psych: Normal mood and affect   ASSESSMENT:   TIRAN KOCHEL is a 69 y.o. male who presents for the following: 1. Chronic systolic heart failure ([place])   2. LBBB (left bundle branch block)   3. Essential hypertension   4. Mixed hyperlipidemia     PLAN:   1. Chronic systolic heart failure ([place]) 2. LBBB (left bundle branch block) -Recent diagnosis systolic heart failure, ejection fraction 45-50.  Left bundle branch block with QRS 158 ms.  He is euvolemic on examination without any evidence of volume overload.  Blood pressure well controlled.  He has minimal symptoms of heart failure.  Most recent cardiac stress test negative without  evidence of ischemia.  We will plan to continue his carvedilol 3.125 mg twice daily as well as losartan 25 mg daily.  He does not require Lasix. -He is continue to work on his diabetes and will need to do so moving forward.  Most recent A1c 11.1.  Recheck later this week.  3. Essential hypertension -Blood pressure well controlled today.  4. Mixed hyperlipidemia -Continue Lipitor 40 mg daily. Most recent LDL 75.  We will have rechecked by primary care physician.  Disposition: Return in about 6 months (around 02/21/2020).  Medication Adjustments/Labs and Tests Ordered: Current medicines are reviewed at length with the patient today.  Concerns regarding medicines are outlined above.  No orders of the defined types were placed in this encounter.  No orders of the defined types were placed in this encounter.   Patient Instructions  Medication Instructions:  The current medical regimen is effective;  continue present plan and medications.  *If you need a refill on your cardiac medications before your next appointment, please call your pharmacy*   Follow-Up: At [facility], you and your health needs are our priority.  As part of our continuing mission to provide you with exceptional heart care, we have created designated Provider Care Teams.  These Care Teams include your primary Cardiologist (physician) and Advanced Practice Providers (APPs -  Physician Assistants and Nurse Practitioners) who all work together to provide you with the care you need, when you need it.  We recommend signing up for the patient portal called "MyChart".  Sign up information is provided on this After Visit Summary.  MyChart is used to connect with patients for Virtual Visits (Telemedicine).  Patients are able to view lab/test results, encounter notes, upcoming appointments, etc.  Non-urgent messages can be sent to your provider as well.   To learn more about what you can do with MyChart, go to  [email or web address].    Your next appointment:   6 month(s)  The format for your next appointment:   In Person  Provider:  Eleonore Chiquito, MD         Time Spent with Patient: I have spent a total of 35 minutes with patient reviewing hospital notes, telemetry, EKGs, labs and examining the patient as well as establishing an assessment and plan that was discussed with the patient.  > 50% of time was spent in direct patient care.  Signed, Addison Naegeli. Audie Box, [place]  [address] [place], [place] [address] [phone]  08/21/2019 3:03 PM

## 2019-08-21 ENCOUNTER — Encounter: Payer: Self-pay | Admitting: Cardiovascular Disease

## 2019-08-21 ENCOUNTER — Ambulatory Visit: Payer: Medicare HMO | Admitting: Cardiovascular Disease

## 2019-08-21 ENCOUNTER — Other Ambulatory Visit: Payer: Self-pay

## 2019-08-21 VITALS — BP 136/84 | HR 58 | Ht 72.0 in | Wt 242.8 lb

## 2019-08-21 DIAGNOSIS — I447 Left bundle-branch block, unspecified: Secondary | ICD-10-CM | POA: Diagnosis not present

## 2019-08-21 DIAGNOSIS — E782 Mixed hyperlipidemia: Secondary | ICD-10-CM | POA: Diagnosis not present

## 2019-08-21 DIAGNOSIS — I10 Essential (primary) hypertension: Secondary | ICD-10-CM | POA: Diagnosis not present

## 2019-08-21 DIAGNOSIS — I5022 Chronic systolic (congestive) heart failure: Secondary | ICD-10-CM

## 2019-08-21 NOTE — Patient Instructions (Signed)
Medication Instructions:  The current medical regimen is effective;  continue present plan and medications.  *If you need a refill on your cardiac medications before your next appointment, please call your pharmacy*   Follow-Up: At [facility], you and your health needs are our priority.  As part of our continuing mission to provide you with exceptional heart care, we have created designated Provider Care Teams.  These Care Teams include your primary Cardiologist (physician) and Advanced Practice Providers (APPs -  Physician Assistants and Nurse Practitioners) who all work together to provide you with the care you need, when you need it.  We recommend signing up for the patient portal called "MyChart".  Sign up information is provided on this After Visit Summary.  MyChart is used to connect with patients for Virtual Visits (Telemedicine).  Patients are able to view lab/test results, encounter notes, upcoming appointments, etc.  Non-urgent messages can be sent to your provider as well.   To learn more about what you can do with MyChart, go to [email or web address].    Your next appointment:   6 month(s)  The format for your next appointment:   In Person  Provider:   [place] O'Neal, MD      

## 2019-08-25 DIAGNOSIS — I5022 Chronic systolic (congestive) heart failure: Secondary | ICD-10-CM | POA: Diagnosis not present

## 2019-10-19 DIAGNOSIS — Z9181 History of falling: Secondary | ICD-10-CM | POA: Diagnosis not present

## 2019-10-19 DIAGNOSIS — Z1331 Encounter for screening for depression: Secondary | ICD-10-CM | POA: Diagnosis not present

## 2019-10-19 DIAGNOSIS — Z6832 Body mass index (BMI) 32.0-32.9, adult: Secondary | ICD-10-CM | POA: Diagnosis not present

## 2019-10-19 DIAGNOSIS — Z0000 Encounter for general adult medical examination without abnormal findings: Secondary | ICD-10-CM | POA: Diagnosis not present

## 2019-10-19 DIAGNOSIS — E785 Hyperlipidemia, unspecified: Secondary | ICD-10-CM | POA: Diagnosis not present

## 2019-10-19 DIAGNOSIS — Z139 Encounter for screening, unspecified: Secondary | ICD-10-CM | POA: Diagnosis not present

## 2019-10-19 DIAGNOSIS — Z125 Encounter for screening for malignant neoplasm of prostate: Secondary | ICD-10-CM | POA: Diagnosis not present

## 2019-10-19 DIAGNOSIS — Z1211 Encounter for screening for malignant neoplasm of colon: Secondary | ICD-10-CM | POA: Diagnosis not present

## 2019-10-31 DIAGNOSIS — G894 Chronic pain syndrome: Secondary | ICD-10-CM | POA: Diagnosis not present

## 2019-10-31 DIAGNOSIS — I5022 Chronic systolic (congestive) heart failure: Secondary | ICD-10-CM | POA: Diagnosis not present

## 2019-10-31 DIAGNOSIS — I10 Essential (primary) hypertension: Secondary | ICD-10-CM | POA: Diagnosis not present

## 2019-10-31 DIAGNOSIS — Z79891 Long term (current) use of opiate analgesic: Secondary | ICD-10-CM | POA: Diagnosis not present

## 2019-10-31 DIAGNOSIS — D510 Vitamin B12 deficiency anemia due to intrinsic factor deficiency: Secondary | ICD-10-CM | POA: Diagnosis not present

## 2019-10-31 DIAGNOSIS — E1142 Type 2 diabetes mellitus with diabetic polyneuropathy: Secondary | ICD-10-CM | POA: Diagnosis not present

## 2019-10-31 DIAGNOSIS — G9050 Complex regional pain syndrome I, unspecified: Secondary | ICD-10-CM | POA: Diagnosis not present

## 2019-10-31 DIAGNOSIS — E785 Hyperlipidemia, unspecified: Secondary | ICD-10-CM | POA: Diagnosis not present

## 2019-10-31 DIAGNOSIS — F419 Anxiety disorder, unspecified: Secondary | ICD-10-CM | POA: Diagnosis not present

## 2019-12-21 ENCOUNTER — Other Ambulatory Visit: Payer: Self-pay

## 2019-12-21 ENCOUNTER — Telehealth: Payer: Self-pay | Admitting: Cardiovascular Disease

## 2019-12-21 MED ORDER — LOSARTAN POTASSIUM 25 MG PO TABS: 25.0000 mg | ORAL_TABLET | Freq: Every day | ORAL | 3 refills | Status: DC

## 2019-12-21 MED ORDER — CARVEDILOL 3.125 MG PO TABS: 3.1250 mg | ORAL_TABLET | Freq: Two times a day (BID) | ORAL | 3 refills | Status: DC

## 2019-12-21 NOTE — Telephone Encounter (Signed)
Call to schedule f/u appt. No voicemail

## 2019-12-26 DIAGNOSIS — E119 Type 2 diabetes mellitus without complications: Secondary | ICD-10-CM | POA: Diagnosis not present

## 2019-12-26 DIAGNOSIS — H524 Presbyopia: Secondary | ICD-10-CM | POA: Diagnosis not present

## 2019-12-26 DIAGNOSIS — H2513 Age-related nuclear cataract, bilateral: Secondary | ICD-10-CM | POA: Diagnosis not present

## 2019-12-26 DIAGNOSIS — Z7984 Long term (current) use of oral hypoglycemic drugs: Secondary | ICD-10-CM | POA: Diagnosis not present

## 2019-12-26 DIAGNOSIS — H35013 Changes in retinal vascular appearance, bilateral: Secondary | ICD-10-CM | POA: Diagnosis not present

## 2020-01-03 ENCOUNTER — Telehealth: Payer: Self-pay | Admitting: Cardiovascular Disease

## 2020-01-03 NOTE — Telephone Encounter (Signed)
I attempted to contact patient to schedule follow with O'Neal from recall list, no answer, unable to LVM

## 2020-02-01 DIAGNOSIS — E785 Hyperlipidemia, unspecified: Secondary | ICD-10-CM | POA: Diagnosis not present

## 2020-02-01 DIAGNOSIS — E1142 Type 2 diabetes mellitus with diabetic polyneuropathy: Secondary | ICD-10-CM | POA: Diagnosis not present

## 2020-02-01 DIAGNOSIS — F419 Anxiety disorder, unspecified: Secondary | ICD-10-CM | POA: Diagnosis not present

## 2020-02-01 DIAGNOSIS — I10 Essential (primary) hypertension: Secondary | ICD-10-CM | POA: Diagnosis not present

## 2020-02-01 DIAGNOSIS — I5022 Chronic systolic (congestive) heart failure: Secondary | ICD-10-CM | POA: Diagnosis not present

## 2020-02-01 DIAGNOSIS — Z79899 Other long term (current) drug therapy: Secondary | ICD-10-CM | POA: Diagnosis not present

## 2020-02-01 DIAGNOSIS — D510 Vitamin B12 deficiency anemia due to intrinsic factor deficiency: Secondary | ICD-10-CM | POA: Diagnosis not present

## 2020-02-01 DIAGNOSIS — G894 Chronic pain syndrome: Secondary | ICD-10-CM | POA: Diagnosis not present

## 2020-03-14 ENCOUNTER — Encounter: Payer: Self-pay | Admitting: General Practice

## 2020-03-19 ENCOUNTER — Telehealth: Payer: Self-pay | Admitting: Cardiovascular Disease

## 2020-03-19 NOTE — Telephone Encounter (Signed)
Called patient to schedule follow up with O'Neal from recall list, no answer and unable to LVM, not set up

## 2020-03-29 DIAGNOSIS — I10 Essential (primary) hypertension: Secondary | ICD-10-CM | POA: Diagnosis not present

## 2020-03-29 DIAGNOSIS — E291 Testicular hypofunction: Secondary | ICD-10-CM | POA: Diagnosis not present

## 2020-03-29 DIAGNOSIS — I952 Hypotension due to drugs: Secondary | ICD-10-CM | POA: Diagnosis not present

## 2020-04-05 ENCOUNTER — Emergency Department (HOSPITAL_COMMUNITY): Payer: Medicare HMO

## 2020-04-05 ENCOUNTER — Telehealth: Payer: Self-pay | Admitting: Internal Medicine

## 2020-04-05 ENCOUNTER — Observation Stay (HOSPITAL_COMMUNITY)
Admission: EM | Admit: 2020-04-05 | Discharge: 2020-04-06 | Disposition: A | Discharge status: Home / Self Care | Payer: Medicare HMO | Attending: Internal Medicine | Admitting: Internal Medicine

## 2020-04-05 ENCOUNTER — Observation Stay (HOSPITAL_COMMUNITY): Payer: Medicare HMO

## 2020-04-05 ENCOUNTER — Encounter (HOSPITAL_COMMUNITY): Payer: Self-pay | Admitting: Internal Medicine

## 2020-04-05 ENCOUNTER — Other Ambulatory Visit: Payer: Self-pay

## 2020-04-05 DIAGNOSIS — I5042 Chronic combined systolic (congestive) and diastolic (congestive) heart failure: Secondary | ICD-10-CM | POA: Insufficient documentation

## 2020-04-05 DIAGNOSIS — J189 Pneumonia, unspecified organism: Secondary | ICD-10-CM | POA: Diagnosis not present

## 2020-04-05 DIAGNOSIS — Z87891 Personal history of nicotine dependence: Secondary | ICD-10-CM | POA: Diagnosis not present

## 2020-04-05 DIAGNOSIS — N179 Acute kidney failure, unspecified: Secondary | ICD-10-CM | POA: Insufficient documentation

## 2020-04-05 DIAGNOSIS — U071 COVID-19: Secondary | ICD-10-CM | POA: Diagnosis not present

## 2020-04-05 DIAGNOSIS — I110 Hypertensive heart disease with heart failure: Secondary | ICD-10-CM | POA: Diagnosis not present

## 2020-04-05 DIAGNOSIS — R001 Bradycardia, unspecified: Secondary | ICD-10-CM | POA: Insufficient documentation

## 2020-04-05 DIAGNOSIS — R55 Syncope and collapse: Secondary | ICD-10-CM | POA: Diagnosis not present

## 2020-04-05 DIAGNOSIS — N390 Urinary tract infection, site not specified: Secondary | ICD-10-CM | POA: Insufficient documentation

## 2020-04-05 DIAGNOSIS — Z79899 Other long term (current) drug therapy: Secondary | ICD-10-CM | POA: Insufficient documentation

## 2020-04-05 DIAGNOSIS — I447 Left bundle-branch block, unspecified: Secondary | ICD-10-CM | POA: Diagnosis not present

## 2020-04-05 DIAGNOSIS — Z794 Long term (current) use of insulin: Secondary | ICD-10-CM | POA: Diagnosis not present

## 2020-04-05 DIAGNOSIS — E1165 Type 2 diabetes mellitus with hyperglycemia: Secondary | ICD-10-CM | POA: Insufficient documentation

## 2020-04-05 DIAGNOSIS — I440 Atrioventricular block, first degree: Secondary | ICD-10-CM | POA: Diagnosis not present

## 2020-04-05 DIAGNOSIS — I4430 Unspecified atrioventricular block: Secondary | ICD-10-CM | POA: Diagnosis not present

## 2020-04-05 LAB — BASIC METABOLIC PANEL
Anion gap: 13 (ref 5–15)
BUN: 25 mg/dL — ABNORMAL HIGH (ref 8–23)
CO2: 22 mmol/L (ref 22–32)
Calcium: 9.5 mg/dL (ref 8.9–10.3)
Chloride: 99 mmol/L (ref 98–111)
Creatinine, Ser: 1.56 mg/dL — ABNORMAL HIGH (ref 0.61–1.24)
GFR, Estimated: 48 mL/min — ABNORMAL LOW (ref >60)
Glucose, Bld: 346 mg/dL — ABNORMAL HIGH (ref 70–99)
Potassium: 4 mmol/L (ref 3.5–5.1)
Sodium: 134 mmol/L — ABNORMAL LOW (ref 135–145)

## 2020-04-05 LAB — URINALYSIS, ROUTINE W REFLEX MICROSCOPIC
Bilirubin Urine: NEGATIVE
Glucose, UA: >=500 mg/dL — AB
Ketones, ur: NEGATIVE mg/dL
Nitrite: NEGATIVE
Protein, ur: NEGATIVE mg/dL
Specific Gravity, Urine: 1.024 (ref 1.005–1.030)
WBC, UA: >50 WBC/hpf — ABNORMAL HIGH (ref 0–5)
pH: 5 (ref 5.0–8.0)

## 2020-04-05 LAB — CBC
HCT: 38.8 % — ABNORMAL LOW (ref 39.0–52.0)
Hemoglobin: 13.2 g/dL (ref 13.0–17.0)
MCH: 30.9 pg (ref 26.0–34.0)
MCHC: 34 g/dL (ref 30.0–36.0)
MCV: 90.9 fL (ref 80.0–100.0)
Platelets: 174 10*3/uL (ref 150–400)
RBC: 4.27 MIL/uL (ref 4.22–5.81)
RDW: 13.2 % (ref 11.5–15.5)
WBC: 6.8 10*3/uL (ref 4.0–10.5)
nRBC: 0 % (ref 0.0–0.2)

## 2020-04-05 LAB — RESPIRATORY PANEL BY RT PCR (FLU A&B, COVID)
Influenza A by PCR: NEGATIVE
Influenza B by PCR: NEGATIVE
SARS Coronavirus 2 by RT PCR: POSITIVE — AB

## 2020-04-05 LAB — GLUCOSE, CAPILLARY
Glucose-Capillary: 358 mg/dL — ABNORMAL HIGH (ref 70–99)
Glucose-Capillary: 295 mg/dL — ABNORMAL HIGH (ref 70–99)

## 2020-04-05 LAB — BRAIN NATRIURETIC PEPTIDE: B Natriuretic Peptide: 56.1 pg/mL (ref 0.0–100.0)

## 2020-04-05 LAB — TROPONIN I (HIGH SENSITIVITY)
Troponin I (High Sensitivity): 33 ng/L — ABNORMAL HIGH (ref <18)
Troponin I (High Sensitivity): 29 ng/L — ABNORMAL HIGH (ref <18)

## 2020-04-05 LAB — CBG MONITORING, ED: Glucose-Capillary: 305 mg/dL — ABNORMAL HIGH (ref 70–99)

## 2020-04-05 MED ORDER — OMEGA-3-ACID ETHYL ESTERS 1 G PO CAPS
1.0000 g | ORAL_CAPSULE | Freq: Every day | ORAL | Status: DC
  Administered 2020-04-05 – 2020-04-06 (×2): 1 g via ORAL
  Filled 2020-04-05 (×3): qty 1

## 2020-04-05 MED ORDER — SODIUM CHLORIDE 0.9 % IV SOLN
1.0000 g | INTRAVENOUS | Status: DC
  Administered 2020-04-06: 1 g via INTRAVENOUS
  Filled 2020-04-05: qty 10

## 2020-04-05 MED ORDER — INSULIN ASPART 100 UNIT/ML ~~LOC~~ SOLN
0.0000 [IU] | Freq: Three times a day (TID) | SUBCUTANEOUS | Status: DC
  Administered 2020-04-05: 15 [IU] via SUBCUTANEOUS
  Administered 2020-04-06: 5 [IU] via SUBCUTANEOUS
  Administered 2020-04-06: 8 [IU] via SUBCUTANEOUS

## 2020-04-05 MED ORDER — POLYETHYLENE GLYCOL 3350 17 G PO PACK: 17.0000 g | PACK | Freq: Every day | ORAL | Status: DC | PRN

## 2020-04-05 MED ORDER — GABAPENTIN 400 MG PO CAPS
1200.0000 mg | ORAL_CAPSULE | Freq: Three times a day (TID) | ORAL | Status: DC | PRN
  Administered 2020-04-05: 1200 mg via ORAL
  Filled 2020-04-05: qty 4

## 2020-04-05 MED ORDER — AMLODIPINE BESYLATE 2.5 MG PO TABS
2.5000 mg | ORAL_TABLET | Freq: Every day | ORAL | Status: DC
  Administered 2020-04-05 – 2020-04-06 (×2): 2.5 mg via ORAL
  Filled 2020-04-05 (×2): qty 1

## 2020-04-05 MED ORDER — ROPINIROLE HCL 1 MG PO TABS
4.0000 mg | ORAL_TABLET | Freq: Every day | ORAL | Status: DC
  Administered 2020-04-05: 4 mg via ORAL
  Filled 2020-04-05 (×2): qty 4

## 2020-04-05 MED ORDER — GUAIFENESIN-DM 100-10 MG/5ML PO SYRP: 10.0000 mL | ORAL_SOLUTION | ORAL | Status: DC | PRN

## 2020-04-05 MED ORDER — SODIUM CHLORIDE 0.9 % IV SOLN
1.0000 g | Freq: Once | INTRAVENOUS | Status: AC
  Administered 2020-04-05: 1 g via INTRAVENOUS
  Filled 2020-04-05: qty 10

## 2020-04-05 MED ORDER — ACETAMINOPHEN 650 MG RE SUPP: 650.0000 mg | Freq: Four times a day (QID) | RECTAL | Status: DC | PRN

## 2020-04-05 MED ORDER — ASCORBIC ACID 500 MG PO TABS
500.0000 mg | ORAL_TABLET | Freq: Every day | ORAL | Status: DC
  Administered 2020-04-05 – 2020-04-06 (×2): 500 mg via ORAL
  Filled 2020-04-05 (×2): qty 1

## 2020-04-05 MED ORDER — ONDANSETRON HCL 4 MG PO TABS: 4.0000 mg | ORAL_TABLET | Freq: Four times a day (QID) | ORAL | Status: DC | PRN

## 2020-04-05 MED ORDER — INSULIN ASPART 100 UNIT/ML ~~LOC~~ SOLN
0.0000 [IU] | Freq: Every day | SUBCUTANEOUS | Status: DC
  Administered 2020-04-05: 3 [IU] via SUBCUTANEOUS

## 2020-04-05 MED ORDER — ESCITALOPRAM OXALATE 20 MG PO TABS
20.0000 mg | ORAL_TABLET | Freq: Every day | ORAL | Status: DC
  Administered 2020-04-05 – 2020-04-06 (×2): 20 mg via ORAL
  Filled 2020-04-05: qty 1
  Filled 2020-04-05: qty 2

## 2020-04-05 MED ORDER — ENOXAPARIN SODIUM 40 MG/0.4ML ~~LOC~~ SOLN
40.0000 mg | SUBCUTANEOUS | Status: DC
  Administered 2020-04-05 – 2020-04-06 (×2): 40 mg via SUBCUTANEOUS
  Filled 2020-04-05 (×2): qty 0.4

## 2020-04-05 MED ORDER — ONDANSETRON HCL 4 MG/2ML IJ SOLN: 4.0000 mg | Freq: Four times a day (QID) | INTRAMUSCULAR | Status: DC | PRN

## 2020-04-05 MED ORDER — SODIUM CHLORIDE 0.9% FLUSH
3.0000 mL | Freq: Two times a day (BID) | INTRAVENOUS | Status: DC
  Administered 2020-04-05 – 2020-04-06 (×2): 3 mL via INTRAVENOUS

## 2020-04-05 MED ORDER — ACETAMINOPHEN 325 MG PO TABS: 650.0000 mg | ORAL_TABLET | Freq: Four times a day (QID) | ORAL | Status: DC | PRN

## 2020-04-05 MED ORDER — ATORVASTATIN CALCIUM 80 MG PO TABS
80.0000 mg | ORAL_TABLET | Freq: Every day | ORAL | Status: DC
  Administered 2020-04-05: 80 mg via ORAL
  Filled 2020-04-05: qty 1

## 2020-04-05 MED ORDER — INSULIN GLARGINE 100 UNIT/ML ~~LOC~~ SOLN
10.0000 [IU] | Freq: Every day | SUBCUTANEOUS | Status: DC
  Administered 2020-04-05: 10 [IU] via SUBCUTANEOUS
  Filled 2020-04-05 (×4): qty 0.1

## 2020-04-05 MED ORDER — HYDROMORPHONE HCL 2 MG PO TABS
4.0000 mg | ORAL_TABLET | Freq: Four times a day (QID) | ORAL | Status: DC | PRN
  Administered 2020-04-05 – 2020-04-06 (×4): 4 mg via ORAL
  Filled 2020-04-05 (×4): qty 2

## 2020-04-05 MED ORDER — ZINC SULFATE 220 (50 ZN) MG PO CAPS
220.0000 mg | ORAL_CAPSULE | Freq: Every day | ORAL | Status: DC
  Administered 2020-04-05 – 2020-04-06 (×2): 220 mg via ORAL
  Filled 2020-04-05 (×2): qty 1

## 2020-04-05 NOTE — H&P (Signed)
History and Physical    Brent Mendoza LZJ:[id number] DOB: [birth year] DOA: 04/05/2020  PCP: Nicoletta Dress, MD  Patient coming from: Home  Chief Complaint: passing out  HPI: Brent Mendoza is a 69 y.o. male with medical history significant of NIDDM, HTN, HFrEF. Presents after fall and loss of consciousness. Went to bathroom and fell out in the bathroom. Says this is the first time he has ever passed out. He denies any CP, visual changes, or auditory changes prior to the fall. He does not remember the fall. It was seen by his wife who denies foaming at the mouth, tonic-clonic movement, bowel/bladder incontinence. Of note, he has been dizzy for the past couple of weeks. He saw his cardiologist about it and had and unspecified medication taken off his regimen. However, he still has difficulty with fatigue and dizziness when he tried to do work in the yard or at home. He tried to cope with it, but moving more slowly and deliberately. The patient's wife called for EMS to bring him to the ED. When EMS arrived, his HR was 38 and he was diaphoretic.    ED Course: Work up revealed hyperglycemia and a UTI. He was also found to be positive for COVID  Review of Systems:  Review of systems is otherwise negative for all not mentioned in HPI.   PMHx Past Medical History:  Diagnosis Date  . CHF (congestive heart failure) ([place])   . Diabetes mellitus without complication ([place])   . Hyperlipidemia   . Hypertension     PSHx Past Surgical History:  Procedure Laterality Date  . LEG SURGERY      SocHx  reports that he has quit smoking. He quit after 13.00 years of use. He has never used smokeless tobacco. He reports that he does not drink alcohol and does not use drugs.  Allergies  Allergen Reactions  . Promethazine     Other reaction(s): Cardiovascular Arrest (ALLERGY/intolerance) CODED IN PAST DUE TO ADMINISTRATION  . Adhesive [Tape]   . Codeine     Other reaction(s): Other (See  Comments) Vomiting and headaches    FamHx Family History  Problem Relation Age of Onset  . Heart disease Mother   . Heart disease Father   . Stroke Brother     Prior to Admission medications   Medication Sig Start Date End Date Taking? Authorizing Provider  atorvastatin (LIPITOR) 80 MG tablet Take 80 mg by mouth daily at 6 PM.  09/28/16  Yes [provider]  carvedilol (COREG) 3.125 MG tablet Take 1 tablet (3.125 mg total) by mouth 2 (two) times daily with a meal. 12/21/19  Yes O'Neal, Cassie Freer, MD  Empagliflozin (JARDIANCE PO) Take 1 tablet by mouth every morning.   Yes [provider]  escitalopram (LEXAPRO) 20 MG tablet Take 20 mg by mouth daily.  09/28/16  Yes [provider]  esomeprazole (NEXIUM) 20 MG capsule Take 20 mg by mouth daily.  07/21/16  Yes [provider]  gabapentin (NEURONTIN) 400 MG capsule Take 1,200 mg by mouth 3 (three) times daily as needed (pain).  07/24/16  Yes [provider]  HYDROmorphone (DILAUDID) 4 MG tablet Take 4 mg by mouth every 6 (six) hours as needed for severe pain.  10/05/16  Yes [provider]  insulin glargine (LANTUS SOLOSTAR) 100 UNIT/ML Solostar Pen Inject 10 Units into the skin at bedtime.   Yes [provider]  Omega-3 Fatty Acids (FISH OIL) 1200 MG CAPS Take 1  capsule (1,200 mg total) by mouth daily. 07/22/19  Yes Benay Pike, MD  rOPINIRole (REQUIP) 2 MG tablet Take 4 mg by mouth at bedtime. Restless leg 03/18/10  Yes [provider]    Physical Exam: Vitals:   04/05/20 0051 04/05/20 0113 04/05/20 0206 04/05/20 0645  BP:  140/73 (!) 146/79 120/64  Pulse:  76 80 66  Resp:  16 20 16   Temp:  97.6 F (36.4 C) 98.1 F (36.7 C)   TempSrc:  Oral Oral   SpO2:  95% 95% 99%  Weight: 106.6 kg     Height: 6' (1.829 m)       General: 69 y.o. male resting in bed in NAD Eyes: PERRL, normal sclera ENMT: Nares patent w/o discharge, orophaynx clear, dentition normal, ears  w/o discharge/lesions/ulcers Neck: Supple, trachea midline Cardiovascular: RRR, +S1, S2, no m/g/r, equal pulses throughout Respiratory: CTABL, no w/r/r, normal WOB GI: BS+, NDNT, no masses noted, no organomegaly noted MSK: No e/c/c Skin: No rashes, bruises, ulcerations noted Neuro: A&O x 3, no focal deficits Psyc: Appropriate interaction and affect, calm/cooperative  Labs on Admission: I have personally reviewed following labs and imaging studies  CBC: Recent Labs  Lab 04/05/20 0058  WBC 6.8  HGB 13.2  HCT 38.8*  MCV 90.9  PLT 607   Basic Metabolic Panel: Recent Labs  Lab 04/05/20 0058  NA 134*  K 4.0  CL 99  CO2 22  GLUCOSE 346*  BUN 25*  CREATININE 1.56*  CALCIUM 9.5   GFR: Estimated Creatinine Clearance: 57.2 mL/min (A) (by C-G formula based on SCr of 1.56 mg/dL (H)). Liver Function Tests: No results for input(s): AST, ALT, ALKPHOS, BILITOT, PROT, ALBUMIN in the last 168 hours. No results for input(s): LIPASE, AMYLASE in the last 168 hours. No results for input(s): AMMONIA in the last 168 hours. Coagulation Profile: No results for input(s): INR, PROTIME in the last 168 hours. Cardiac Enzymes: No results for input(s): CKTOTAL, CKMB, CKMBINDEX, TROPONINI in the last 168 hours. BNP (last 3 results) No results for input(s): PROBNP in the last 8760 hours. HbA1C: No results for input(s): HGBA1C in the last 72 hours. CBG: Recent Labs  Lab 04/05/20 0527  GLUCAP 305*   Lipid Profile: No results for input(s): CHOL, HDL, LDLCALC, TRIG, CHOLHDL, LDLDIRECT in the last 72 hours. Thyroid Function Tests: No results for input(s): TSH, T4TOTAL, FREET4, T3FREE, THYROIDAB in the last 72 hours. Anemia Panel: No results for input(s): VITAMINB12, FOLATE, FERRITIN, TIBC, IRON, RETICCTPCT in the last 72 hours. Urine analysis:    Component Value Date/Time   COLORURINE YELLOW 04/05/2020 0216   APPEARANCEUR CLOUDY (A) 04/05/2020 0216   LABSPEC 1.024 04/05/2020 0216   PHURINE  5.0 04/05/2020 0216   GLUCOSEU >=500 (A) 04/05/2020 0216   HGBUR SMALL (A) 04/05/2020 0216   BILIRUBINUR NEGATIVE 04/05/2020 0216   KETONESUR NEGATIVE 04/05/2020 0216   PROTEINUR NEGATIVE 04/05/2020 0216   NITRITE NEGATIVE 04/05/2020 0216   LEUKOCYTESUR LARGE (A) 04/05/2020 0216    Radiological Exams on Admission: DG Chest Port 1 View  Result Date: 04/05/2020 CLINICAL DATA:  Syncope EXAM: PORTABLE CHEST 1 VIEW COMPARISON:  07/21/2019 FINDINGS: Heart size and pulmonary vascularity are normal. Suggestion of patchy infiltrates in the left perihilar region. Possibly pneumonia or aspiration. No pleural effusions. No pneumothorax. Mediastinal contours appear intact. Calcification of the aorta. IMPRESSION: Suggestion of patchy infiltrates in the left perihilar region. Electronically Signed   By: Lucienne Capers M.D.   On: 04/05/2020 06:41  EKG: Independently reviewed. Sinus w/ 1st block  Assessment/Plan Syncope     - admit to obs, telemetry     - echo, trp, orthostatics     - syncopal episode was w/o tonic-clonic movement; but wife says he had a "blank stare". No Hx of seizure, but will check EEG     - he is found to have UTI; started on rocephin, continue     - he was bradycardic at the scene (HR 38), spoke with cards about this; will hold coreg and start norvasc; they may speak with EP about his case   UTI     - started on rocephin, continue     - awaiting UCx  DM2 w/ hyperglycemia     - SSI, A1c, CBG, DM diet  Chronic HFrEF     - hold coreg after discussion with cards about his bradycardia     - watch I&O, daily wts  COVID positive     - he reports that he has had his first dose of vax; he denies previous dx of or treatment for COVID     - he reports wife had COVID last month     - no respiratory symptoms at this time     - have asked pharmacy to review for MAB infusion; not appropriate at this time per their review  DVT prophylaxis: lovenox  Code Status: FULL  Family  Communication: With wife at bedside.  Consults called: Cardiology.  Admission status: Observation   Status is: Observation  The patient remains OBS appropriate and will d/c before 2 midnights.  Dispo: The patient is from: Home              Anticipated d/c is to: Home              Anticipated d/c date is: 1 day              Patient currently is not medically stable to d/c.  Jonnie Finner DO [facility]  If 7PM-7AM, please contact night-coverage [email or web address]  04/05/2020, 7:16 AM

## 2020-04-05 NOTE — Progress Notes (Signed)
Pharmacy COVID-19 Monoclonal Antibody Screening  Brent Mendoza was identified as being not hospitalized with symptoms from Covid-19 on admission but an incidental positive PCR has been documented.  The patient may qualify for the use of monoclonal antibodies (mAB) for COVID-19 viral infection to prevent worsening symptoms stemming from Covid-19 infection.  The patient was identified based on a positive COVID-19 PCR and not requiring the use of supplemental oxygen at this time.  This patient meets the FDA criteria for Emergency Use Authorization of casirivimab/imdevimab or bamlanivimab/etesevimab.  Has a (+) direct SARS-CoV-2 viral test result  Is NOT hospitalized due to COVID-19  Is within 10 days of symptom onset  Has at least one of the high risk factor(s) for progression to severe COVID-19 and/or hospitalization as defined in EUA.  Specific high risk criteria : Older age (>/= 68 yo), BMI > 25, Diabetes and Cardiovascular disease or hypertension  Additionally: The patient has not had a positive COVID-19 PCR in the last 90 days.  The patient has received first dose of vaccine against COVID-19.  The patient does not meet criteria for mAB administration due to being partially or fully vaccinated for COVID-19, asymptomatic, with a cycle time > 32.   This eligibility and indication for treatment was discussed with the patients physician: Dr. Cherylann Ratel  Plan: Based on the above discussion, it was decided that the patient will NOT receive a dose of mAB combination.   [name], [name] 04/05/2020  11:01 AM

## 2020-04-05 NOTE — ED Provider Notes (Signed)
[facility] EMERGENCY DEPARTMENT Provider Note   CSN: [id number] Arrival date & time: 04/05/20  0045     History Chief Complaint  Patient presents with  . Loss of Consciousness    Brent Mendoza is a 69 y.o. male with a hx of CHF, NIDDM, High cholesterol, HTN presents to the Emergency Department after acute syncope onset just PTA.  Pt wife reports he was walking to the bathroom when he lost consciousness and fell.  She reports he was unresponsive for approx 2-3 minutes with associated pallor and diaphoresis.  She reports upon EMS arrival pt's HR was 38 and he was hypotensive.  Pt reports he has had several episodes like this in the last week.  His PCP stopped his losartan last Friday due to this.  Pt reports no prodrome and does not remember the incident.  Pt wife reports that every time patient gets lightheaded he also get diaphoretic.  [facility].  Pt had COVID several months ago.  Last stress test and Echo were 07/2019.  EF at that time was 45%.   The history is provided by the patient and medical records. No language interpreter was used.       Past Medical History:  Diagnosis Date  . CHF (congestive heart failure) ([place])   . Diabetes mellitus without complication ([place])   . Hyperlipidemia   . Hypertension     Patient Active Problem List   Diagnosis Date Noted  . Elevated TSH 07/22/2019  . DM (diabetes mellitus), type 2, uncontrolled ([place]) 07/22/2019  . SOB (shortness of breath) 07/21/2019  . Depression 02/28/2013  . Chronic otitis media 10/23/2011  . Eustachian tube dysfunction 10/23/2011  . Mixed hearing loss 10/23/2011  . Erythrocytosis 07/14/2011  . Ear cysts 03/31/2011  . APL (acute promyelocytic leukemia) in remission ([place]) 04/15/2002    Past Surgical History:  Procedure Laterality Date  . LEG SURGERY         Family History  Problem Relation Age of Onset  . Heart disease Mother   . Heart disease  Father   . Stroke Brother     Social History   Tobacco Use  . Smoking status: Former Smoker    Years: 13.00  . Smokeless tobacco: Never Used  Substance Use Topics  . Alcohol use: No  . Drug use: Never    Home Medications Prior to Admission medications   Medication Sig Start Date End Date Taking? Authorizing Provider  atorvastatin (LIPITOR) 40 MG tablet Take 40 mg by mouth daily at 6 PM.  09/28/16   [provider]  carvedilol (COREG) 3.125 MG tablet Take 1 tablet (3.125 mg total) by mouth 2 (two) times daily with a meal. 12/21/19   O'Neal, Cassie Freer, MD  escitalopram (LEXAPRO) 20 MG tablet  09/28/16   [provider]  esomeprazole (NEXIUM) 20 MG capsule Take 20 mg by mouth daily.  07/21/16   [provider]  gabapentin (NEURONTIN) 400 MG capsule Take 1,200 mg by mouth 3 (three) times daily as needed (pain).  07/24/16   [provider]  HYDROmorphone (DILAUDID) 4 MG tablet Take 4 mg by mouth every 6 (six) hours as needed for severe pain.  10/05/16   [provider]  LORazepam (ATIVAN) 2 MG tablet Take 2 mg by mouth at bedtime as needed for anxiety or sleep.  09/30/16   [provider]  losartan (COZAAR) 25 MG tablet Take 1 tablet (25 mg  total) by mouth daily. 12/21/19 03/20/20  Geralynn Rile, MD  metFORMIN (GLUCOPHAGE) 500 MG tablet Take 1,000 mg by mouth 2 (two) times daily with a meal.  09/28/16   [provider]  Omega-3 Fatty Acids (FISH OIL) 1200 MG CAPS Take 1 capsule (1,200 mg total) by mouth daily. 07/22/19   Benay Pike, MD  rOPINIRole (REQUIP) 2 MG tablet Take 4 mg by mouth at bedtime as needed. Restless leg 03/18/10   [provider]    Allergies    Promethazine, Adhesive [tape], and Codeine  Review of Systems   Review of Systems  Constitutional: Positive for diaphoresis and fatigue. Negative for appetite change, fever and unexpected weight change.  HENT: Negative for mouth sores.   Eyes: Negative  for visual disturbance.  Respiratory: Negative for cough, chest tightness, shortness of breath and wheezing.   Cardiovascular: Positive for syncope. Negative for chest pain.  Gastrointestinal: Positive for nausea. Negative for abdominal pain, constipation, diarrhea and vomiting.  Endocrine: Negative for polydipsia, polyphagia and polyuria.  Genitourinary: Negative for dysuria, frequency, hematuria and urgency.  Musculoskeletal: Negative for back pain and neck stiffness.  Skin: Negative for rash.  Allergic/Immunologic: Negative for immunocompromised state.  Neurological: Positive for syncope and light-headedness. Negative for headaches.  Hematological: Does not bruise/bleed easily.  Psychiatric/Behavioral: Negative for sleep disturbance. The patient is not nervous/anxious.     Physical Exam Updated Vital Signs BP (!) 146/79 (BP Location: Right Arm)   Pulse 80   Temp 98.1 F (36.7 C) (Oral)   Resp 20   Ht 6' (1.829 m)   Wt 106.6 kg   SpO2 95%   BMI 31.87 kg/m   Physical Exam Vitals and nursing note reviewed.  Constitutional:      General: He is not in acute distress.    Appearance: He is not diaphoretic.  HENT:     Head: Normocephalic.  Eyes:     General: No scleral icterus.    Conjunctiva/sclera: Conjunctivae normal.  Cardiovascular:     Rate and Rhythm: Normal rate and regular rhythm.     Pulses: Normal pulses.          Radial pulses are 2+ on the right side and 2+ on the left side.       Dorsalis pedis pulses are 2+ on the right side and 2+ on the left side.       Posterior tibial pulses are 2+ on the right side and 2+ on the left side.  Pulmonary:     Effort: No tachypnea, accessory muscle usage, prolonged expiration, respiratory distress or retractions.     Breath sounds: No stridor.     Comments: Equal chest rise. No increased work of breathing. Abdominal:     General: There is no distension.     Palpations: Abdomen is soft.     Tenderness: There is no abdominal  tenderness. There is no guarding or rebound.  Musculoskeletal:     Cervical back: Normal range of motion.     Comments: Moves all extremities equally and without difficulty.  Skin:    General: Skin is warm and dry.     Capillary Refill: Capillary refill takes less than 2 seconds.  Neurological:     Mental Status: He is alert.     GCS: GCS eye subscore is 4. GCS verbal subscore is 5. GCS motor subscore is 6.     Comments: Speech is clear and goal oriented.  Psychiatric:  Mood and Affect: Mood normal.     ED Results / Procedures / Treatments   Labs (all labs ordered are listed, but only abnormal results are displayed) Labs Reviewed  BASIC METABOLIC PANEL - Abnormal; Notable for the following components:      Result Value   Sodium 134 (*)    Glucose, Bld 346 (*)    BUN 25 (*)    Creatinine, Ser 1.56 (*)    GFR, Estimated 48 (*)    All other components within normal limits  CBC - Abnormal; Notable for the following components:   HCT 38.8 (*)    All other components within normal limits  URINALYSIS, ROUTINE W REFLEX MICROSCOPIC - Abnormal; Notable for the following components:   APPearance CLOUDY (*)    Glucose, UA >=500 (*)    Hgb urine dipstick SMALL (*)    Leukocytes,Ua LARGE (*)    WBC, UA >50 (*)    Bacteria, UA RARE (*)    All other components within normal limits  CBG MONITORING, ED - Abnormal; Notable for the following components:   Glucose-Capillary 305 (*)    All other components within normal limits  URINE CULTURE  RESPIRATORY PANEL BY RT PCR (FLU A&B, COVID)  BRAIN NATRIURETIC PEPTIDE  TROPONIN I (HIGH SENSITIVITY)    EKG EKG Interpretation  Date/Time:  Friday April 05 2020 00:46:33 EDT Ventricular Rate:  84 PR Interval:  234 QRS Duration: 156 QT Interval:  468 QTC Calculation: 553 R Axis:   50 Text Interpretation: Sinus rhythm with 1st degree A-V block Left bundle branch block Abnormal ECG No significant change since last tracing Confirmed by  Orpah Greek [phone]) on 04/05/2020 5:18:31 AM   Radiology No results found.  Procedures Procedures (including critical care time)  Medications Ordered in ED Medications  cefTRIAXone (ROCEPHIN) 1 g in sodium chloride 0.9 % 100 mL IVPB (has no administration in time range)    ED Course  I have reviewed the triage vital signs and the nursing notes.  Pertinent labs & imaging results that were available during my care of the patient were reviewed by me and considered in my medical decision making (see chart for details).    MDM Rules/Calculators/A&P                           Patient presents with high risk syncope.  Bradycardia found by EMS on arrival along with hypotension.  I suspect this is the cause of patient's syncope.  Here in the emergency department he is found to have urinary tract infection.  Rocephin ordered along with urine culture.  Mild AKI with creatinine of 1.56.  No hypotension here in the emergency department.  No arrhythmia or bradycardia noted while here.  Troponin, BNP are pending.  Patient does have a history of non-insulin-dependent diabetes.  Elevated blood sugar 346 today without an elevated anion gap.  EKG is without acute ischemia.  Patient will need admission for high risk syncope and cardiology consult.   6:25 AM Discussed patient's case with hospitalist, Dr. Tobie Poet.  I have recommended admission and patient (and family if present) agree with this plan. Awaiting Trop, BNP and CXR.  Admitting physician will round this AM and will place admission orders once these have resulted.    The patient was discussed with and seen by Dr. Betsey Holiday who agrees with the treatment plan.   Final Clinical Impression(s) / ED Diagnoses Final diagnoses:  Syncope and collapse  AKI (acute kidney injury) ([place])  Bradycardia  Urinary tract infection without hematuria, site unspecified    Rx / DC Orders ED Discharge Orders    None       Finleigh Cheong, Gwenlyn Perking 04/05/20 5733    Orpah Greek, MD 04/05/20 [phone]

## 2020-04-05 NOTE — ED Notes (Signed)
Report given to floor RN

## 2020-04-05 NOTE — Telephone Encounter (Signed)
Spoke to ED physician at [place] had been dizzy, meds adjusted by PCP. Had dizziness/syncope   HR reported 38 by EMS In ER  HR 60s , 70s   BP OK  Pt found to be COVID positive  WIll be gettting monoclonal AB  Hx of chronic LBBB, first degree AV block  No change  Recomm: Stop carvedilol Start low dose amlodipine 2.5   Follow BP at home, hold if low I have discussed with Beckie Salts (EP) He will see in clinic to review rates

## 2020-04-05 NOTE — ED Notes (Signed)
Admitting MD at bedside. Pt requesting pain medicine. MD aware.

## 2020-04-05 NOTE — ED Notes (Signed)
Lunch Tray Ordered @ 1020.  

## 2020-04-05 NOTE — Telephone Encounter (Signed)
Error

## 2020-04-05 NOTE — ED Triage Notes (Signed)
Pt was at home tonight and was walking into bathroom per wife and she said pt just sat straight down on his butt and then went backwards and passed out. Pt  Said he does not remeber anything. Pt is not on any blood thinners. Pt said he has been seeing a cardiologist and all has been good. Sugars were 386 per ems

## 2020-04-05 NOTE — Telephone Encounter (Signed)
Thanks

## 2020-04-05 NOTE — CV Procedure (Signed)
Echocardiogram not completed. Patient is being moved up to his room shortly.   Darlina Sicilian RDCS

## 2020-04-05 NOTE — ED Notes (Signed)
Attempted to call report to 5W. RN unable to take report at this time.

## 2020-04-05 NOTE — ED Notes (Signed)
Pt requesting for pain medication. Admitting provider aware.

## 2020-04-06 ENCOUNTER — Observation Stay (HOSPITAL_BASED_OUTPATIENT_CLINIC_OR_DEPARTMENT_OTHER): Payer: Medicare HMO

## 2020-04-06 DIAGNOSIS — E1165 Type 2 diabetes mellitus with hyperglycemia: Secondary | ICD-10-CM | POA: Diagnosis not present

## 2020-04-06 DIAGNOSIS — R001 Bradycardia, unspecified: Secondary | ICD-10-CM | POA: Diagnosis not present

## 2020-04-06 DIAGNOSIS — U071 COVID-19: Secondary | ICD-10-CM | POA: Diagnosis not present

## 2020-04-06 DIAGNOSIS — N179 Acute kidney failure, unspecified: Secondary | ICD-10-CM | POA: Diagnosis not present

## 2020-04-06 DIAGNOSIS — R55 Syncope and collapse: Secondary | ICD-10-CM

## 2020-04-06 DIAGNOSIS — N390 Urinary tract infection, site not specified: Secondary | ICD-10-CM | POA: Diagnosis not present

## 2020-04-06 DIAGNOSIS — Z87891 Personal history of nicotine dependence: Secondary | ICD-10-CM | POA: Diagnosis not present

## 2020-04-06 DIAGNOSIS — Z794 Long term (current) use of insulin: Secondary | ICD-10-CM | POA: Diagnosis not present

## 2020-04-06 DIAGNOSIS — I110 Hypertensive heart disease with heart failure: Secondary | ICD-10-CM | POA: Diagnosis not present

## 2020-04-06 DIAGNOSIS — I5042 Chronic combined systolic (congestive) and diastolic (congestive) heart failure: Secondary | ICD-10-CM | POA: Diagnosis not present

## 2020-04-06 LAB — ECHOCARDIOGRAM COMPLETE
Area-P 1/2: 2.87 cm2
Calc EF: 45.4 %
Height: 72 in
S' Lateral: 4.1 cm
Single Plane A2C EF: 46.2 %
Single Plane A4C EF: 45 %
Weight: 3781.33 oz

## 2020-04-06 LAB — BASIC METABOLIC PANEL
Anion gap: 9 (ref 5–15)
BUN: 18 mg/dL (ref 8–23)
CO2: 24 mmol/L (ref 22–32)
Calcium: 9.6 mg/dL (ref 8.9–10.3)
Chloride: 103 mmol/L (ref 98–111)
Creatinine, Ser: 1.15 mg/dL (ref 0.61–1.24)
GFR, Estimated: >60 mL/min (ref >60)
Glucose, Bld: 258 mg/dL — ABNORMAL HIGH (ref 70–99)
Potassium: 4.2 mmol/L (ref 3.5–5.1)
Sodium: 136 mmol/L (ref 135–145)

## 2020-04-06 LAB — GLUCOSE, CAPILLARY
Glucose-Capillary: 261 mg/dL — ABNORMAL HIGH (ref 70–99)
Glucose-Capillary: 233 mg/dL — ABNORMAL HIGH (ref 70–99)
Glucose-Capillary: 287 mg/dL — ABNORMAL HIGH (ref 70–99)

## 2020-04-06 LAB — D-DIMER, QUANTITATIVE: D-Dimer, Quant: <0.27 ug/mL-FEU (ref 0.00–0.50)

## 2020-04-06 LAB — HEMOGLOBIN A1C
Hgb A1c MFr Bld: 10.8 % — ABNORMAL HIGH (ref 4.8–5.6)
Mean Plasma Glucose: 263 mg/dL

## 2020-04-06 LAB — FERRITIN: Ferritin: 661 ng/mL — ABNORMAL HIGH (ref 24–336)

## 2020-04-06 LAB — MAGNESIUM: Magnesium: 2.1 mg/dL (ref 1.7–2.4)

## 2020-04-06 LAB — C-REACTIVE PROTEIN: CRP: 1.1 mg/dL — ABNORMAL HIGH (ref <1.0)

## 2020-04-06 MED ORDER — AMLODIPINE BESYLATE 2.5 MG PO TABS
2.5000 mg | ORAL_TABLET | Freq: Every day | ORAL | 0 refills | Status: DC
Start: 2020-04-07 — End: 2021-04-24

## 2020-04-06 MED ORDER — SODIUM CHLORIDE 0.9 % IV SOLN: INTRAVENOUS | Status: AC

## 2020-04-06 MED ORDER — PERFLUTREN LIPID MICROSPHERE
1.0000 mL | INTRAVENOUS | Status: AC | PRN
  Administered 2020-04-06: 2 mL via INTRAVENOUS
  Filled 2020-04-06: qty 10

## 2020-04-06 MED ORDER — LANTUS SOLOSTAR 100 UNIT/ML ~~LOC~~ SOPN: 14.0000 [IU] | PEN_INJECTOR | Freq: Every day | SUBCUTANEOUS | 11 refills | Status: AC

## 2020-04-06 MED ORDER — CEPHALEXIN 500 MG PO CAPS: 500.0000 mg | ORAL_CAPSULE | Freq: Four times a day (QID) | ORAL | 0 refills | Status: AC

## 2020-04-06 NOTE — Discharge Instructions (Signed)
Person Under Monitoring Name: Brent Mendoza  Location: [place] [address]   Infection Prevention Recommendations for Individuals Confirmed to have, or Being Evaluated for, 2019 Novel Coronavirus (COVID-19) Infection Who Receive Care at Home  Individuals who are confirmed to have, or are being evaluated for, COVID-19 should follow the prevention steps below until a healthcare provider or local or state health department says they can return to normal activities.  Stay home except to get medical care You should restrict activities outside your home, except for getting medical care. Do not go to work, school, or public areas, and do not use public transportation or taxis.  Call ahead before visiting your doctor Before your medical appointment, call the healthcare provider and tell them that you have, or are being evaluated for, COVID-19 infection. This will help the healthcare provider's office take steps to keep other people from getting infected. Ask your healthcare provider to call the local or state health department.  Monitor your symptoms Seek prompt medical attention if your illness is worsening (e.g., difficulty breathing). Before going to your medical appointment, call the healthcare provider and tell them that you have, or are being evaluated for, COVID-19 infection. Ask your healthcare provider to call the local or state health department.  Wear a facemask You should wear a facemask that covers your nose and mouth when you are in the same room with other people and when you visit a healthcare provider. People who live with or visit you should also wear a facemask while they are in the same room with you.  Separate yourself from other people in your home As much as possible, you should stay in a different room from other people in your home. Also, you should use a separate bathroom, if available.  Avoid sharing household items You should  not share dishes, drinking glasses, cups, eating utensils, towels, bedding, or other items with other people in your home. After using these items, you should wash them thoroughly with soap and water.  Cover your coughs and sneezes Cover your mouth and nose with a tissue when you cough or sneeze, or you can cough or sneeze into your sleeve. Throw used tissues in a lined trash can, and immediately wash your hands with soap and water for at least 20 seconds or use an alcohol-based hand rub.  Wash your [facility] your hands often and thoroughly with soap and water for at least 20 seconds. You can use an alcohol-based hand sanitizer if soap and water are not available and if your hands are not visibly dirty. Avoid touching your eyes, nose, and mouth with unwashed hands.   Prevention Steps for Caregivers and Household Members of Individuals Confirmed to have, or Being Evaluated for, COVID-19 Infection Being Cared for in the Home  If you live with, or provide care at home for, a person confirmed to have, or being evaluated for, COVID-19 infection please follow these guidelines to prevent infection:  Follow healthcare provider's instructions Make sure that you understand and can help the patient follow any healthcare provider instructions for all care.  Provide for the patient's basic needs You should help the patient with basic needs in the home and provide support for getting groceries, prescriptions, and other personal needs.  Monitor the patient's symptoms If they are getting sicker, call his or her medical provider and tell them that the patient has, or is being evaluated for, COVID-19 infection. This will help the healthcare provider's  office take steps to keep other people from getting infected. Ask the healthcare provider to call the local or state health department.  Limit the number of people who have contact with the patient  If possible, have only one caregiver for the  patient.  Other household members should stay in another home or place of residence. If this is not possible, they should stay  in another room, or be separated from the patient as much as possible. Use a separate bathroom, if available.  Restrict visitors who do not have an essential need to be in the home.  Keep older adults, very young children, and other sick people away from the patient Keep older adults, very young children, and those who have compromised immune systems or chronic health conditions away from the patient. This includes people with chronic heart, lung, or kidney conditions, diabetes, and cancer.  Ensure good ventilation Make sure that shared spaces in the home have good air flow, such as from an air conditioner or an opened window, weather permitting.  Wash your hands often  Wash your hands often and thoroughly with soap and water for at least 20 seconds. You can use an alcohol based hand sanitizer if soap and water are not available and if your hands are not visibly dirty.  Avoid touching your eyes, nose, and mouth with unwashed hands.  Use disposable paper towels to dry your hands. If not available, use dedicated cloth towels and replace them when they become wet.  Wear a facemask and gloves  Wear a disposable facemask at all times in the room and gloves when you touch or have contact with the patient's blood, body fluids, and/or secretions or excretions, such as sweat, saliva, sputum, nasal mucus, vomit, urine, or feces.  Ensure the mask fits over your nose and mouth tightly, and do not touch it during use.  Throw out disposable facemasks and gloves after using them. Do not reuse.  Wash your hands immediately after removing your facemask and gloves.  If your personal clothing becomes contaminated, carefully remove clothing and launder. Wash your hands after handling contaminated clothing.  Place all used disposable facemasks, gloves, and other waste in a lined  container before disposing them with other household waste.  Remove gloves and wash your hands immediately after handling these items.  Do not share dishes, glasses, or other household items with the patient  Avoid sharing household items. You should not share dishes, drinking glasses, cups, eating utensils, towels, bedding, or other items with a patient who is confirmed to have, or being evaluated for, COVID-19 infection.  After the person uses these items, you should wash them thoroughly with soap and water.  Wash laundry thoroughly  Immediately remove and wash clothes or bedding that have blood, body fluids, and/or secretions or excretions, such as sweat, saliva, sputum, nasal mucus, vomit, urine, or feces, on them.  Wear gloves when handling laundry from the patient.  Read and follow directions on labels of laundry or clothing items and detergent. In general, wash and dry with the warmest temperatures recommended on the label.  Clean all areas the individual has used often  Clean all touchable surfaces, such as counters, tabletops, doorknobs, bathroom fixtures, toilets, phones, keyboards, tablets, and bedside tables, every day. Also, clean any surfaces that may have blood, body fluids, and/or secretions or excretions on them.  Wear gloves when cleaning surfaces the patient has come in contact with.  Use a diluted bleach solution (e.g., dilute bleach with 1  part bleach and 10 parts water) or a household disinfectant with a label that says EPA-registered for coronaviruses. To make a bleach solution at home, add 1 tablespoon of bleach to 1 quart (4 cups) of water. For a larger supply, add  cup of bleach to 1 gallon (16 cups) of water.  Read labels of cleaning products and follow recommendations provided on product labels. Labels contain instructions for safe and effective use of the cleaning product including precautions you should take when applying the product, such as wearing gloves or  eye protection and making sure you have good ventilation during use of the product.  Remove gloves and wash hands immediately after cleaning.  Monitor yourself for signs and symptoms of illness Caregivers and household members are considered close contacts, should monitor their health, and will be asked to limit movement outside of the home to the extent possible. Follow the monitoring steps for close contacts listed on the symptom monitoring form.   ? If you have additional questions, contact your local health department or call the epidemiologist on call at [phone] (available 24/7). ? This guidance is subject to change. For the most up-to-date guidance from [facility], please refer to their website: [email or web address]

## 2020-04-06 NOTE — Discharge Summary (Signed)
Brent Mendoza, is a 69 y.o. male  DOB 12/10/1950  MRN [id number].  Admission date:  04/05/2020  Admitting Physician  Jonnie Finner, DO  Discharge Date:  04/06/2020   Primary MD  Nicoletta Dress, MD  Recommendations for primary care physician for things to follow:  -Check CBC, CMP during next visit. -Please continue adjustment of his diabetic regimen. -Patient starts to follow with cardiology in 1 to 2 weeks regarding further recommendation about antihypertensive regimen, and cardiac medications. -Please follow on the final results of his urine cultures.   Admission Diagnosis  Syncope and collapse [R55] Bradycardia [R00.1] Syncope [R55] AKI (acute kidney injury) ([place]) [N17.9] Urinary tract infection without hematuria, site unspecified [N39.0]   Discharge Diagnosis  Syncope and collapse [R55] Bradycardia [R00.1] Syncope [R55] AKI (acute kidney injury) ([place]) [N17.9] Urinary tract infection without hematuria, site unspecified [N39.0]      Active Problems:   Syncope      Past Medical History:  Diagnosis Date  . CHF (congestive heart failure) ([place])   . Diabetes mellitus without complication ([place])   . Hyperlipidemia   . Hypertension     Past Surgical History:  Procedure Laterality Date  . LEG SURGERY         History of present illness and  Hospital Course:     Kindly see H&P for history of present illness and admission details, please review complete Labs, Consult reports and Test reports for all details in brief  HPI  from the history and physical done on the day of admission 04/05/2020  HPI: Brent Mendoza is a 69 y.o. male with medical history significant of NIDDM, HTN, HFrEF. Presents after fall and loss of consciousness. Went to bathroom and fell out in the bathroom. Says this is the first time he has ever passed out. He denies any CP, visual changes, or auditory changes  prior to the fall. He does not remember the fall. It was seen by his wife who denies foaming at the mouth, tonic-clonic movement, bowel/bladder incontinence. Of note, he has been dizzy for the past couple of weeks. He saw his cardiologist about it and had and unspecified medication taken off his regimen. However, he still has difficulty with fatigue and dizziness when he tried to do work in the yard or at home. He tried to cope with it, but moving more slowly and deliberately. The patient's wife called for EMS to bring him to the ED. When EMS arrived, his HR was 38 and he was diaphoretic.    ED Course: Work up revealed hyperglycemia and a UTI. He was also found to be positive for [facility] Course   Syncope -Multifactorial, due to bradycardia from beta-blockers, as well due to UTI infection, poorly controlled diabetes mellitus with hyperglycemia, he was admitted for observation on telemetry, noted to have first-degree AV block, but no malignant arrhythmias, his Coreg has been held and started on Norvasc instead per cardiology recommendation in ED, he ambulated in the hallway, no dizziness or  lightheadedness.  His UTI has been treated, he was appropriately hydrated, no further bradycardia, he will be discharged with recommendation to follow with his cardiology as an outpatient, he was admitted - he was bradycardic at the scene (HR 38), admitting physician discussed with card, ; will hold coreg and start norvasc. - repeat 2 D echo admission showing EF 45 to 50% with global hypokinesis, which is stable from his most recent echo in February of this year.  UTI - he reports dysuria and polyuria, urine culture growing gram-negative rods, received IV rocephin  X 2, will dc the 5 days of Keflex for total of 7 days treatment, please follow on final results of urine cultures..  DM2, poorly controlled with hyperglycemia - A1c is 10.8 , resume home meds, will increase lantus from 10>14 units  daily.  Chronic systolic/diastolic CHF - please see above discussion, Coreg will be stopped due to bradycardia , he was instructed to follow with his cardiologist in 1 to 2 weeks regarding further recommendations.  Marland Kitchen  COVID positive - he reports that he has had his first dose of vaccine in August, reports symptoms was in early September, no further symptoms currently, he does not qualify for MAB , then it is more than 10 days of infection.. -He is currently asymptomatic.   Discharge Condition:  stable   Follow UP   Follow-up Information    Nicoletta Dress, MD Follow up in 1 week(s).   Specialty: Internal Medicine Contact information: [address] [place] [address] [phone]        O'Neal, Cassie Freer, MD .   Specialties: Internal Medicine, Cardiology, Radiology Contact information: [address] [place] [address] [phone]                 Discharge Instructions  and  Discharge Medications    Discharge Instructions    Discharge instructions   Complete by: As directed    ?   Person Under Monitoring Name: Brent Mendoza  Location: [place] [address]   Infection Prevention Recommendations for Individuals Confirmed to have, or Being Evaluated for, 2019 Novel Coronavirus (COVID-19) Infection Who Receive Care at Home  Individuals who are confirmed to have, or are being evaluated for, COVID-19 should follow the prevention steps below until a healthcare provider or local or state health department says they can return to normal activities.  Stay home except to get medical care You should restrict activities outside your home, except for getting medical care. Do not go to work, school, or public areas, and do not use public transportation or taxis.  Call ahead before visiting your doctor Before your medical appointment, call the healthcare provider and tell them that you have, or are being  evaluated for, COVID-19 infection. This will help the healthcare provider's office take steps to keep other people from getting infected. Ask your healthcare provider to call the local or state health department.  Monitor your symptoms Seek prompt medical attention if your illness is worsening (e.g., difficulty breathing). Before going to your medical appointment, call the healthcare provider and tell them that you have, or are being evaluated for, COVID-19 infection. Ask your healthcare provider to call the local or state health department.  Wear a facemask You should wear a facemask that covers your nose and mouth when you are in the same room with other people and when you visit a healthcare provider. People who live with or visit you should  also wear a facemask while they are in the same room with you.  Separate yourself from other people in your home As much as possible, you should stay in a different room from other people in your home. Also, you should use a separate bathroom, if available.  Avoid sharing household items You should not share dishes, drinking glasses, cups, eating utensils, towels, bedding, or other items with other people in your home. After using these items, you should wash them thoroughly with soap and water.  Cover your coughs and sneezes Cover your mouth and nose with a Mendoza when you cough or sneeze, or you can cough or sneeze into your sleeve. Throw used tissues in a lined trash can, and immediately wash your hands with soap and water for at least 20 seconds or use an alcohol-based hand rub.  Wash your [facility] your hands often and thoroughly with soap and water for at least 20 seconds. You can use an alcohol-based hand sanitizer if soap and water are not available and if your hands are not visibly dirty. Avoid touching your eyes, nose, and mouth with unwashed hands.   Prevention Steps for Caregivers and Household Members of Individuals Confirmed to  have, or Being Evaluated for, COVID-19 Infection Being Cared for in the Home  If you live with, or provide care at home for, a person confirmed to have, or being evaluated for, COVID-19 infection please follow these guidelines to prevent infection:  Follow healthcare provider's instructions Make sure that you understand and can help the patient follow any healthcare provider instructions for all care.  Provide for the patient's basic needs You should help the patient with basic needs in the home and provide support for getting groceries, prescriptions, and other personal needs.  Monitor the patient's symptoms If they are getting sicker, call his or her medical provider and tell them that the patient has, or is being evaluated for, COVID-19 infection. This will help the healthcare provider's office take steps to keep other people from getting infected. Ask the healthcare provider to call the local or state health department.  Limit the number of people who have contact with the patient If possible, have only one caregiver for the patient. Other household members should stay in another home or place of residence. If this is not possible, they should stay in another room, or be separated from the patient as much as possible. Use a separate bathroom, if available. Restrict visitors who do not have an essential need to be in the home.  Keep older adults, very young children, and other sick people away from the patient Keep older adults, very young children, and those who have compromised immune systems or chronic health conditions away from the patient. This includes people with chronic heart, lung, or kidney conditions, diabetes, and cancer.  Ensure good ventilation Make sure that shared spaces in the home have good air flow, such as from an air conditioner or an opened window, weather permitting.  Wash your hands often Wash your hands often and thoroughly with soap and water for at least  20 seconds. You can use an alcohol based hand sanitizer if soap and water are not available and if your hands are not visibly dirty. Avoid touching your eyes, nose, and mouth with unwashed hands. Use disposable paper towels to dry your hands. If not available, use dedicated cloth towels and replace them when they become wet.  Wear a facemask and gloves Wear a disposable facemask at all times  in the room and gloves when you touch or have contact with the patient's blood, body fluids, and/or secretions or excretions, such as sweat, saliva, sputum, nasal mucus, vomit, urine, or feces.  Ensure the mask fits over your nose and mouth tightly, and do not touch it during use. Throw out disposable facemasks and gloves after using them. Do not reuse. Wash your hands immediately after removing your facemask and gloves. If your personal clothing becomes contaminated, carefully remove clothing and launder. Wash your hands after handling contaminated clothing. Place all used disposable facemasks, gloves, and other waste in a lined container before disposing them with other household waste. Remove gloves and wash your hands immediately after handling these items.  Do not share dishes, glasses, or other household items with the patient Avoid sharing household items. You should not share dishes, drinking glasses, cups, eating utensils, towels, bedding, or other items with a patient who is confirmed to have, or being evaluated for, COVID-19 infection. After the person uses these items, you should wash them thoroughly with soap and water.  Wash laundry thoroughly Immediately remove and wash clothes or bedding that have blood, body fluids, and/or secretions or excretions, such as sweat, saliva, sputum, nasal mucus, vomit, urine, or feces, on them. Wear gloves when handling laundry from the patient. Read and follow directions on labels of laundry or clothing items and detergent. In general, wash and dry with the  warmest temperatures recommended on the label.  Clean all areas the individual has used often Clean all touchable surfaces, such as counters, tabletops, doorknobs, bathroom fixtures, toilets, phones, keyboards, tablets, and bedside tables, every day. Also, clean any surfaces that may have blood, body fluids, and/or secretions or excretions on them. Wear gloves when cleaning surfaces the patient has come in contact with. Use a diluted bleach solution (e.g., dilute bleach with 1 part bleach and 10 parts water) or a household disinfectant with a label that says EPA-registered for coronaviruses. To make a bleach solution at home, add 1 tablespoon of bleach to 1 quart (4 cups) of water. For a larger supply, add  cup of bleach to 1 gallon (16 cups) of water. Read labels of cleaning products and follow recommendations provided on product labels. Labels contain instructions for safe and effective use of the cleaning product including precautions you should take when applying the product, such as wearing gloves or eye protection and making sure you have good ventilation during use of the product. Remove gloves and wash hands immediately after cleaning.  Monitor yourself for signs and symptoms of illness Caregivers and household members are considered close contacts, should monitor their health, and will be asked to limit movement outside of the home to the extent possible. Follow the monitoring steps for close contacts listed on the symptom monitoring form.   ? If you have additional questions, contact your local health department or call the epidemiologist on call at [phone] (available 24/7). ? This guidance is subject to change. For the most up-to-date guidance from CDC, please refer to their website: YouBlogs.pl   Increase activity slowly   Complete by: As directed      Allergies as of 04/06/2020      Reactions   Promethazine     Other reaction(s): Cardiovascular Arrest (ALLERGY/intolerance) CODED IN PAST DUE TO ADMINISTRATION   Adhesive [tape]    Codeine    Other reaction(s): Other (See Comments) Vomiting and headaches      Medication List    STOP taking these medications  carvedilol 3.125 MG tablet Commonly known as: Coreg     TAKE these medications   amLODipine 2.5 MG tablet Commonly known as: NORVASC Take 1 tablet (2.5 mg total) by mouth daily. Start taking on: April 07, 2020   atorvastatin 80 MG tablet Commonly known as: LIPITOR Take 80 mg by mouth daily at 6 PM.   cephALEXin 500 MG capsule Commonly known as: KEFLEX Take 1 capsule (500 mg total) by mouth 4 (four) times daily for 5 days.   escitalopram 20 MG tablet Commonly known as: LEXAPRO Take 20 mg by mouth daily.   Fish Oil 1200 MG Caps Take 1 capsule (1,200 mg total) by mouth daily.   gabapentin 400 MG capsule Commonly known as: NEURONTIN Take 1,200 mg by mouth 3 (three) times daily as needed (pain).   HYDROmorphone 4 MG tablet Commonly known as: DILAUDID Take 4 mg by mouth every 6 (six) hours as needed for severe pain.   JARDIANCE PO Take 1 tablet by mouth every morning.   Lantus SoloStar 100 UNIT/ML Solostar Pen Generic drug: insulin glargine Inject 14 Units into the skin at bedtime. What changed: how much to take   NexIUM 20 MG capsule Generic drug: esomeprazole Take 20 mg by mouth daily.   rOPINIRole 2 MG tablet Commonly known as: REQUIP Take 4 mg by mouth at bedtime. Restless leg         Diet and Activity recommendation: See Discharge Instructions above   Consults obtained -  none  Major procedures and Radiology Reports - PLEASE review detailed and final reports for all details, in brief -     DG Chest Port 1 View  Result Date: 04/05/2020 CLINICAL DATA:  Syncope EXAM: PORTABLE CHEST 1 VIEW COMPARISON:  07/21/2019 FINDINGS: Heart size and pulmonary vascularity are normal. Suggestion of patchy  infiltrates in the left perihilar region. Possibly pneumonia or aspiration. No pleural effusions. No pneumothorax. Mediastinal contours appear intact. Calcification of the aorta. IMPRESSION: Suggestion of patchy infiltrates in the left perihilar region. Electronically Signed   By: Lucienne Capers M.D.   On: 04/05/2020 06:41   ECHOCARDIOGRAM COMPLETE  Result Date: 04/06/2020    ECHOCARDIOGRAM REPORT   Patient Name:   Brent Mendoza Date of Exam: 04/06/2020 Medical Rec #:  [id number]       Height:       72.0 in Accession #:    [id number]      Weight:       236.3 lb Date of Birth:  [birth year]       BSA:          2.287 m Patient Age:    53 years        BP:           135/79 mmHg Patient Gender: M               HR:           73 bpm. Exam Location:  Inpatient Procedure: 2D Echo, Cardiac Doppler, Color Doppler and Intracardiac            Opacification Agent Indications:    R55 Syncope  History:        Patient has prior history of Echocardiogram examinations, most                 recent 07/22/2019. Signs/Symptoms:Dyspnea and Syncope; Risk                 Factors:Diabetes. Covid 19 positive. Leukemia.  Sonographer:  [place] Referring Phys: [id number] [place]  1. Left ventricular ejection fraction, by estimation, is 45 to 50%. The left ventricle has mildly decreased function. The left ventricle demonstrates global hypokinesis. There is mild concentric left ventricular hypertrophy. Left ventricular diastolic parameters are indeterminate.  2. Right ventricular systolic function is normal. The right ventricular size is normal. Tricuspid regurgitation signal is inadequate for assessing PA pressure.  3. The mitral valve is normal in structure. Trivial mitral valve regurgitation. No evidence of mitral stenosis.  4. The aortic valve is tricuspid. There is mild calcification of the aortic valve. There is mild thickening of the aortic valve. Aortic valve regurgitation is not visualized. Mild aortic valve  sclerosis is present, with no evidence of aortic valve stenosis.  5. The inferior vena cava is dilated in size with >50% respiratory variability, suggesting right atrial pressure of 8 mmHg. Comparison(s): No significant change from prior study. Conclusion(s)/Recommendation(s): EF unchanged, with dyssynchrony and mildly reduced EF. No significant valve disease. FINDINGS  Left Ventricle: Left ventricular ejection fraction, by estimation, is 45 to 50%. The left ventricle has mildly decreased function. The left ventricle demonstrates global hypokinesis. Definity contrast agent was given IV to delineate the left ventricular  endocardial borders. The left ventricular internal cavity size was normal in size. There is mild concentric left ventricular hypertrophy. Abnormal (paradoxical) septal motion, consistent with left bundle branch block. Left ventricular diastolic parameters are indeterminate. Right Ventricle: The right ventricular size is normal. No increase in right ventricular wall thickness. Right ventricular systolic function is normal. Tricuspid regurgitation signal is inadequate for assessing PA pressure. Left Atrium: Left atrial size was normal in size. Right Atrium: Right atrial size was normal in size. Pericardium: Trivial pericardial effusion is present. Mitral Valve: The mitral valve is normal in structure. Trivial mitral valve regurgitation. No evidence of mitral valve stenosis. Tricuspid Valve: The tricuspid valve is normal in structure. Tricuspid valve regurgitation is trivial. No evidence of tricuspid stenosis. Aortic Valve: The aortic valve is tricuspid. There is mild calcification of the aortic valve. There is mild thickening of the aortic valve. Aortic valve regurgitation is not visualized. Mild aortic valve sclerosis is present, with no evidence of aortic valve stenosis. Pulmonic Valve: The pulmonic valve was not well visualized. Pulmonic valve regurgitation is not visualized. Aorta: The aortic root,  ascending aorta and aortic arch are all structurally normal, with no evidence of dilitation or obstruction. Venous: The inferior vena cava is dilated in size with greater than 50% respiratory variability, suggesting right atrial pressure of 8 mmHg. IAS/Shunts: No atrial level shunt detected by color flow Doppler.  LEFT VENTRICLE PLAX 2D LVIDd:         4.95 cm      Diastology LVIDs:         4.10 cm      LV e' medial:    4.79 cm/s LV PW:         1.55 cm      LV E/e' medial:  11.5 LV IVS:        1.45 cm      LV e' lateral:   8.59 cm/s LVOT diam:     2.10 cm      LV E/e' lateral: 6.4 LV SV:         58 LV SV Index:   25 LVOT Area:     3.46 cm  LV Volumes (MOD) LV vol d, MOD A2C: 104.0 ml LV vol d, MOD A4C: 111.0 ml  LV vol s, MOD A2C: 56.0 ml LV vol s, MOD A4C: 61.1 ml LV SV MOD A2C:     48.0 ml LV SV MOD A4C:     111.0 ml LV SV MOD BP:      51.0 ml RIGHT VENTRICLE            IVC RV S prime:     8.81 cm/s  IVC diam: 2.50 cm TAPSE (M-mode): 2.5 cm LEFT ATRIUM             Index       RIGHT ATRIUM           Index LA diam:        3.70 cm 1.62 cm/m  RA Area:     12.10 cm LA Vol (A2C):   32.4 ml 14.17 ml/m RA Volume:   25.90 ml  11.32 ml/m LA Vol (A4C):   31.2 ml 13.64 ml/m LA Biplane Vol: 32.7 ml 14.30 ml/m  AORTIC VALVE LVOT Vmax:   91.20 cm/s LVOT Vmean:  61.200 cm/s LVOT VTI:    0.167 m  AORTA Ao Root diam: 3.40 cm Ao Asc diam:  3.40 cm MITRAL VALVE MV Area (PHT): 2.87 cm    SHUNTS MV Decel Time: 264 msec    Systemic VTI:  0.17 m MV E velocity: 55.30 cm/s  Systemic Diam: 2.10 cm MV A velocity: 81.00 cm/s MV E/A ratio:  0.68 Buford Dresser MD Electronically signed by Buford Dresser MD Signature Date/Time: 04/06/2020/3:42:36 PM    Final     Micro Results    Recent Results (from the past 240 hour(s))  Urine culture     Status: Abnormal (Preliminary result)   Collection Time: 04/05/20  2:16 AM   Specimen: Urine, Random  Result Value Ref Range Status   Specimen Description URINE, RANDOM  Final    Special Requests NONE  Final   Culture (A)  Final    >=100,000 COLONIES/mL SERRATIA MARCESCENS SUSCEPTIBILITIES TO FOLLOW Performed at [facility], [address] [place],  [address]    Report Status PENDING  Incomplete  Respiratory Panel by RT PCR (Flu A&B, Covid) - Nasopharyngeal Swab     Status: Abnormal   Collection Time: 04/05/20  6:23 AM   Specimen: Nasopharyngeal Swab  Result Value Ref Range Status   SARS Coronavirus 2 by RT PCR POSITIVE (A) NEGATIVE Final    Comment: emailed L. Berdik RN 8:20 04/05/20 ([email or web address]) (NOTE) SARS-CoV-2 target nucleic acids are DETECTED.  SARS-CoV-2 RNA is generally detectable in upper respiratory specimens  during the acute phase of infection. Positive results are indicative of the presence of the identified virus, but do not rule out bacterial infection or co-infection with other pathogens not detected by the test. Clinical correlation with patient history and other diagnostic information is necessary to determine patient infection status. The expected result is Negative.  Fact Sheet for Patients:  PinkCheek.be  Fact Sheet for Healthcare Providers: GravelBags.it  This test is not yet approved or cleared by the [place] FDA and  has been authorized for detection and/or diagnosis of SARS-CoV-2 by FDA under an Emergency Use Authorization (EUA).  This EUA will remain in effect (meaning this test can be used) for the duration of  the COVID-19  declaration under Section 564(b)(1) of the Act, 21 U.S.C. section 360bbb-3(b)(1), unless the authorization is terminated or revoked sooner.      Influenza A by PCR NEGATIVE NEGATIVE Final   Influenza B by PCR NEGATIVE  NEGATIVE Final    Comment: (NOTE) The Xpert Xpress SARS-CoV-2/FLU/RSV assay is intended as an aid in  the diagnosis of influenza from Nasopharyngeal swab specimens and  should not be used as a sole basis for  treatment. Nasal washings and  aspirates are unacceptable for Xpert Xpress SARS-CoV-2/FLU/RSV  testing.  Fact Sheet for Patients: PinkCheek.be  Fact Sheet for Healthcare Providers: GravelBags.it  This test is not yet approved or cleared by the [place] FDA and  has been authorized for detection and/or diagnosis of SARS-CoV-2 by  FDA under an Emergency Use Authorization (EUA). This EUA will remain  in effect (meaning this test can be used) for the duration of the  Covid-19 declaration under Section 564(b)(1) of the Act, 21  U.S.C. section 360bbb-3(b)(1), unless the authorization is  terminated or revoked. Performed at [facility], [place] [address], [place],  [address]        Today   Subjective:   Brent Mendoza today has no headache,no chest or abdominal pain,no new weakness tingling or numbness, feels much better wants to go home today.  He was ambulated in the hallway today, no dizziness or lightheadedness, he has no further bradycardia on telemetry.  Objective:   Blood pressure (!) 151/74, pulse 66, temperature 98.1 F (36.7 C), temperature source Oral, resp. rate 18, height 6' (1.829 m), weight 107.2 kg, SpO2 95 %.   Intake/Output Summary (Last 24 hours) at 04/06/2020 1636 Last data filed at 04/06/2020 1500 Gross per 24 hour  Intake 1320 ml  Output 1650 ml  Net -330 ml    Exam Awake Alert, Oriented x 3, No new F.N deficits, Normal affect Symmetrical Chest wall movement, Good air movement bilaterally, CTAB RRR,No Gallops,Rubs or new Murmurs, No Parasternal Heave +ve B.Sounds, Abd Soft, Non tender, No rebound -guarding or rigidity. No Cyanosis, Clubbing or edema, No new Rash or bruise  Data Review   CBC w Diff:  Lab Results  Component Value Date   WBC 6.8 04/05/2020   HGB 13.2 04/05/2020   HCT 38.8 (L) 04/05/2020   PLT 174 04/05/2020   LYMPHOPCT 23 07/21/2019   MONOPCT 12 07/21/2019    EOSPCT 2 07/21/2019   BASOPCT 0 07/21/2019    CMP:  Lab Results  Component Value Date   NA 136 04/06/2020   NA 138 08/10/2019   K 4.2 04/06/2020   CL 103 04/06/2020   CO2 24 04/06/2020   BUN 18 04/06/2020   BUN 18 08/10/2019   CREATININE 1.15 04/06/2020   PROT 5.9 (L) 07/22/2019   ALBUMIN 3.5 07/22/2019   BILITOT 0.6 07/22/2019   ALKPHOS 79 07/22/2019   AST 50 (H) 07/22/2019   ALT 80 (H) 07/22/2019  .   Total Time in preparing paper work, data evaluation and todays exam - 22 minutes  Phillips Climes M.D on 04/06/2020 at [place]  [phone]

## 2020-04-06 NOTE — Care Management Obs Status (Signed)
[facility] NOTIFICATION   Patient Details  Name: Brent Mendoza MRN: [id number] Date of Birth: 08/18/1950   Medicare Observation Status Notification Given:  Yes  Verbal permission given to sign for patient due to [place]. Copy of letter sent to patient via tube system  Verdell Carmine, RN 04/06/2020, 12:16 PM

## 2020-04-06 NOTE — Plan of Care (Signed)
Patient states he is not sob when ambulating and his heart rate is stable during ambulation.  He remains on room air and has no complaints.  He is been cleared for d/c by the doctor.

## 2020-04-06 NOTE — Progress Notes (Signed)
  Echocardiogram 2D Echocardiogram has been performed.  Brent Mendoza 04/06/2020, 2:15 PM

## 2020-04-07 LAB — URINE CULTURE: Culture: >=100000 — AB

## 2020-04-08 LAB — HEMOGLOBIN A1C
Hgb A1c MFr Bld: 10.6 % — ABNORMAL HIGH (ref 4.8–5.6)
Mean Plasma Glucose: 258 mg/dL

## 2020-04-15 DIAGNOSIS — E291 Testicular hypofunction: Secondary | ICD-10-CM | POA: Diagnosis not present

## 2020-04-23 ENCOUNTER — Institutional Professional Consult (permissible substitution): Payer: Medicare HMO | Admitting: Internal Medicine

## 2020-04-26 ENCOUNTER — Encounter: Payer: Self-pay | Admitting: General Practice

## 2020-04-26 ENCOUNTER — Telehealth: Payer: Self-pay | Admitting: Cardiovascular Disease

## 2020-04-26 NOTE — Telephone Encounter (Signed)
  Recall expunge letter sent regarding 6 mo f/u

## 2020-04-29 DIAGNOSIS — E291 Testicular hypofunction: Secondary | ICD-10-CM | POA: Diagnosis not present

## 2020-05-06 DIAGNOSIS — G894 Chronic pain syndrome: Secondary | ICD-10-CM | POA: Diagnosis not present

## 2020-05-06 DIAGNOSIS — I10 Essential (primary) hypertension: Secondary | ICD-10-CM | POA: Diagnosis not present

## 2020-05-06 DIAGNOSIS — Z23 Encounter for immunization: Secondary | ICD-10-CM | POA: Diagnosis not present

## 2020-05-06 DIAGNOSIS — F419 Anxiety disorder, unspecified: Secondary | ICD-10-CM | POA: Diagnosis not present

## 2020-05-06 DIAGNOSIS — I5022 Chronic systolic (congestive) heart failure: Secondary | ICD-10-CM | POA: Diagnosis not present

## 2020-05-06 DIAGNOSIS — E785 Hyperlipidemia, unspecified: Secondary | ICD-10-CM | POA: Diagnosis not present

## 2020-05-06 DIAGNOSIS — D510 Vitamin B12 deficiency anemia due to intrinsic factor deficiency: Secondary | ICD-10-CM | POA: Diagnosis not present

## 2020-05-06 DIAGNOSIS — E1142 Type 2 diabetes mellitus with diabetic polyneuropathy: Secondary | ICD-10-CM | POA: Diagnosis not present

## 2020-05-06 DIAGNOSIS — Z79899 Other long term (current) drug therapy: Secondary | ICD-10-CM | POA: Diagnosis not present

## 2020-05-14 DIAGNOSIS — E291 Testicular hypofunction: Secondary | ICD-10-CM | POA: Diagnosis not present

## 2020-05-28 DIAGNOSIS — E291 Testicular hypofunction: Secondary | ICD-10-CM | POA: Diagnosis not present

## 2020-06-13 DIAGNOSIS — E291 Testicular hypofunction: Secondary | ICD-10-CM | POA: Diagnosis not present

## 2020-06-27 DIAGNOSIS — E291 Testicular hypofunction: Secondary | ICD-10-CM | POA: Diagnosis not present

## 2020-07-11 DIAGNOSIS — E291 Testicular hypofunction: Secondary | ICD-10-CM | POA: Diagnosis not present

## 2020-07-25 DIAGNOSIS — E291 Testicular hypofunction: Secondary | ICD-10-CM | POA: Diagnosis not present

## 2020-08-06 DIAGNOSIS — E1142 Type 2 diabetes mellitus with diabetic polyneuropathy: Secondary | ICD-10-CM | POA: Diagnosis not present

## 2020-08-06 DIAGNOSIS — Z79899 Other long term (current) drug therapy: Secondary | ICD-10-CM | POA: Diagnosis not present

## 2020-08-06 DIAGNOSIS — Z125 Encounter for screening for malignant neoplasm of prostate: Secondary | ICD-10-CM | POA: Diagnosis not present

## 2020-08-06 DIAGNOSIS — E785 Hyperlipidemia, unspecified: Secondary | ICD-10-CM | POA: Diagnosis not present

## 2020-08-06 DIAGNOSIS — D510 Vitamin B12 deficiency anemia due to intrinsic factor deficiency: Secondary | ICD-10-CM | POA: Diagnosis not present

## 2020-08-06 DIAGNOSIS — G894 Chronic pain syndrome: Secondary | ICD-10-CM | POA: Diagnosis not present

## 2020-08-06 DIAGNOSIS — I10 Essential (primary) hypertension: Secondary | ICD-10-CM | POA: Diagnosis not present

## 2020-08-06 DIAGNOSIS — F419 Anxiety disorder, unspecified: Secondary | ICD-10-CM | POA: Diagnosis not present

## 2020-08-06 DIAGNOSIS — I5022 Chronic systolic (congestive) heart failure: Secondary | ICD-10-CM | POA: Diagnosis not present

## 2020-08-08 DIAGNOSIS — D519 Vitamin B12 deficiency anemia, unspecified: Secondary | ICD-10-CM | POA: Diagnosis not present

## 2020-08-08 DIAGNOSIS — E291 Testicular hypofunction: Secondary | ICD-10-CM | POA: Diagnosis not present

## 2020-08-22 DIAGNOSIS — E291 Testicular hypofunction: Secondary | ICD-10-CM | POA: Diagnosis not present

## 2020-09-12 DIAGNOSIS — E291 Testicular hypofunction: Secondary | ICD-10-CM | POA: Diagnosis not present

## 2020-09-12 DIAGNOSIS — D510 Vitamin B12 deficiency anemia due to intrinsic factor deficiency: Secondary | ICD-10-CM | POA: Diagnosis not present

## 2020-10-01 DIAGNOSIS — E291 Testicular hypofunction: Secondary | ICD-10-CM | POA: Diagnosis not present

## 2020-10-15 DIAGNOSIS — E291 Testicular hypofunction: Secondary | ICD-10-CM | POA: Diagnosis not present

## 2020-10-15 DIAGNOSIS — D510 Vitamin B12 deficiency anemia due to intrinsic factor deficiency: Secondary | ICD-10-CM | POA: Diagnosis not present

## 2020-10-29 DIAGNOSIS — E291 Testicular hypofunction: Secondary | ICD-10-CM | POA: Diagnosis not present

## 2020-11-20 DIAGNOSIS — D510 Vitamin B12 deficiency anemia due to intrinsic factor deficiency: Secondary | ICD-10-CM | POA: Diagnosis not present

## 2020-11-20 DIAGNOSIS — Z79899 Other long term (current) drug therapy: Secondary | ICD-10-CM | POA: Diagnosis not present

## 2020-11-20 DIAGNOSIS — F331 Major depressive disorder, recurrent, moderate: Secondary | ICD-10-CM | POA: Diagnosis not present

## 2020-11-20 DIAGNOSIS — E291 Testicular hypofunction: Secondary | ICD-10-CM | POA: Diagnosis not present

## 2020-11-20 DIAGNOSIS — E1142 Type 2 diabetes mellitus with diabetic polyneuropathy: Secondary | ICD-10-CM | POA: Diagnosis not present

## 2020-11-20 DIAGNOSIS — G894 Chronic pain syndrome: Secondary | ICD-10-CM | POA: Diagnosis not present

## 2020-11-20 DIAGNOSIS — F419 Anxiety disorder, unspecified: Secondary | ICD-10-CM | POA: Diagnosis not present

## 2020-11-20 DIAGNOSIS — E785 Hyperlipidemia, unspecified: Secondary | ICD-10-CM | POA: Diagnosis not present

## 2020-11-20 DIAGNOSIS — I5022 Chronic systolic (congestive) heart failure: Secondary | ICD-10-CM | POA: Diagnosis not present

## 2020-11-20 DIAGNOSIS — I10 Essential (primary) hypertension: Secondary | ICD-10-CM | POA: Diagnosis not present

## 2020-11-26 DIAGNOSIS — Z0000 Encounter for general adult medical examination without abnormal findings: Secondary | ICD-10-CM | POA: Diagnosis not present

## 2020-11-26 DIAGNOSIS — Z1331 Encounter for screening for depression: Secondary | ICD-10-CM | POA: Diagnosis not present

## 2020-11-26 DIAGNOSIS — Z9181 History of falling: Secondary | ICD-10-CM | POA: Diagnosis not present

## 2020-11-26 DIAGNOSIS — E785 Hyperlipidemia, unspecified: Secondary | ICD-10-CM | POA: Diagnosis not present

## 2020-12-04 DIAGNOSIS — E291 Testicular hypofunction: Secondary | ICD-10-CM | POA: Diagnosis not present

## 2020-12-18 DIAGNOSIS — D519 Vitamin B12 deficiency anemia, unspecified: Secondary | ICD-10-CM | POA: Diagnosis not present

## 2020-12-18 DIAGNOSIS — E291 Testicular hypofunction: Secondary | ICD-10-CM | POA: Diagnosis not present

## 2020-12-25 DIAGNOSIS — E230 Hypopituitarism: Secondary | ICD-10-CM | POA: Diagnosis not present

## 2020-12-25 DIAGNOSIS — N5201 Erectile dysfunction due to arterial insufficiency: Secondary | ICD-10-CM | POA: Diagnosis not present

## 2020-12-30 IMAGING — DX DG CHEST 2V
2 series · 2 of 2 positions shown · non-contrast
Comparison: 07/12/2018

CLINICAL DATA: Shortness of breath no chest pain

EXAM:
CHEST - 2 VIEW

[chest pa]
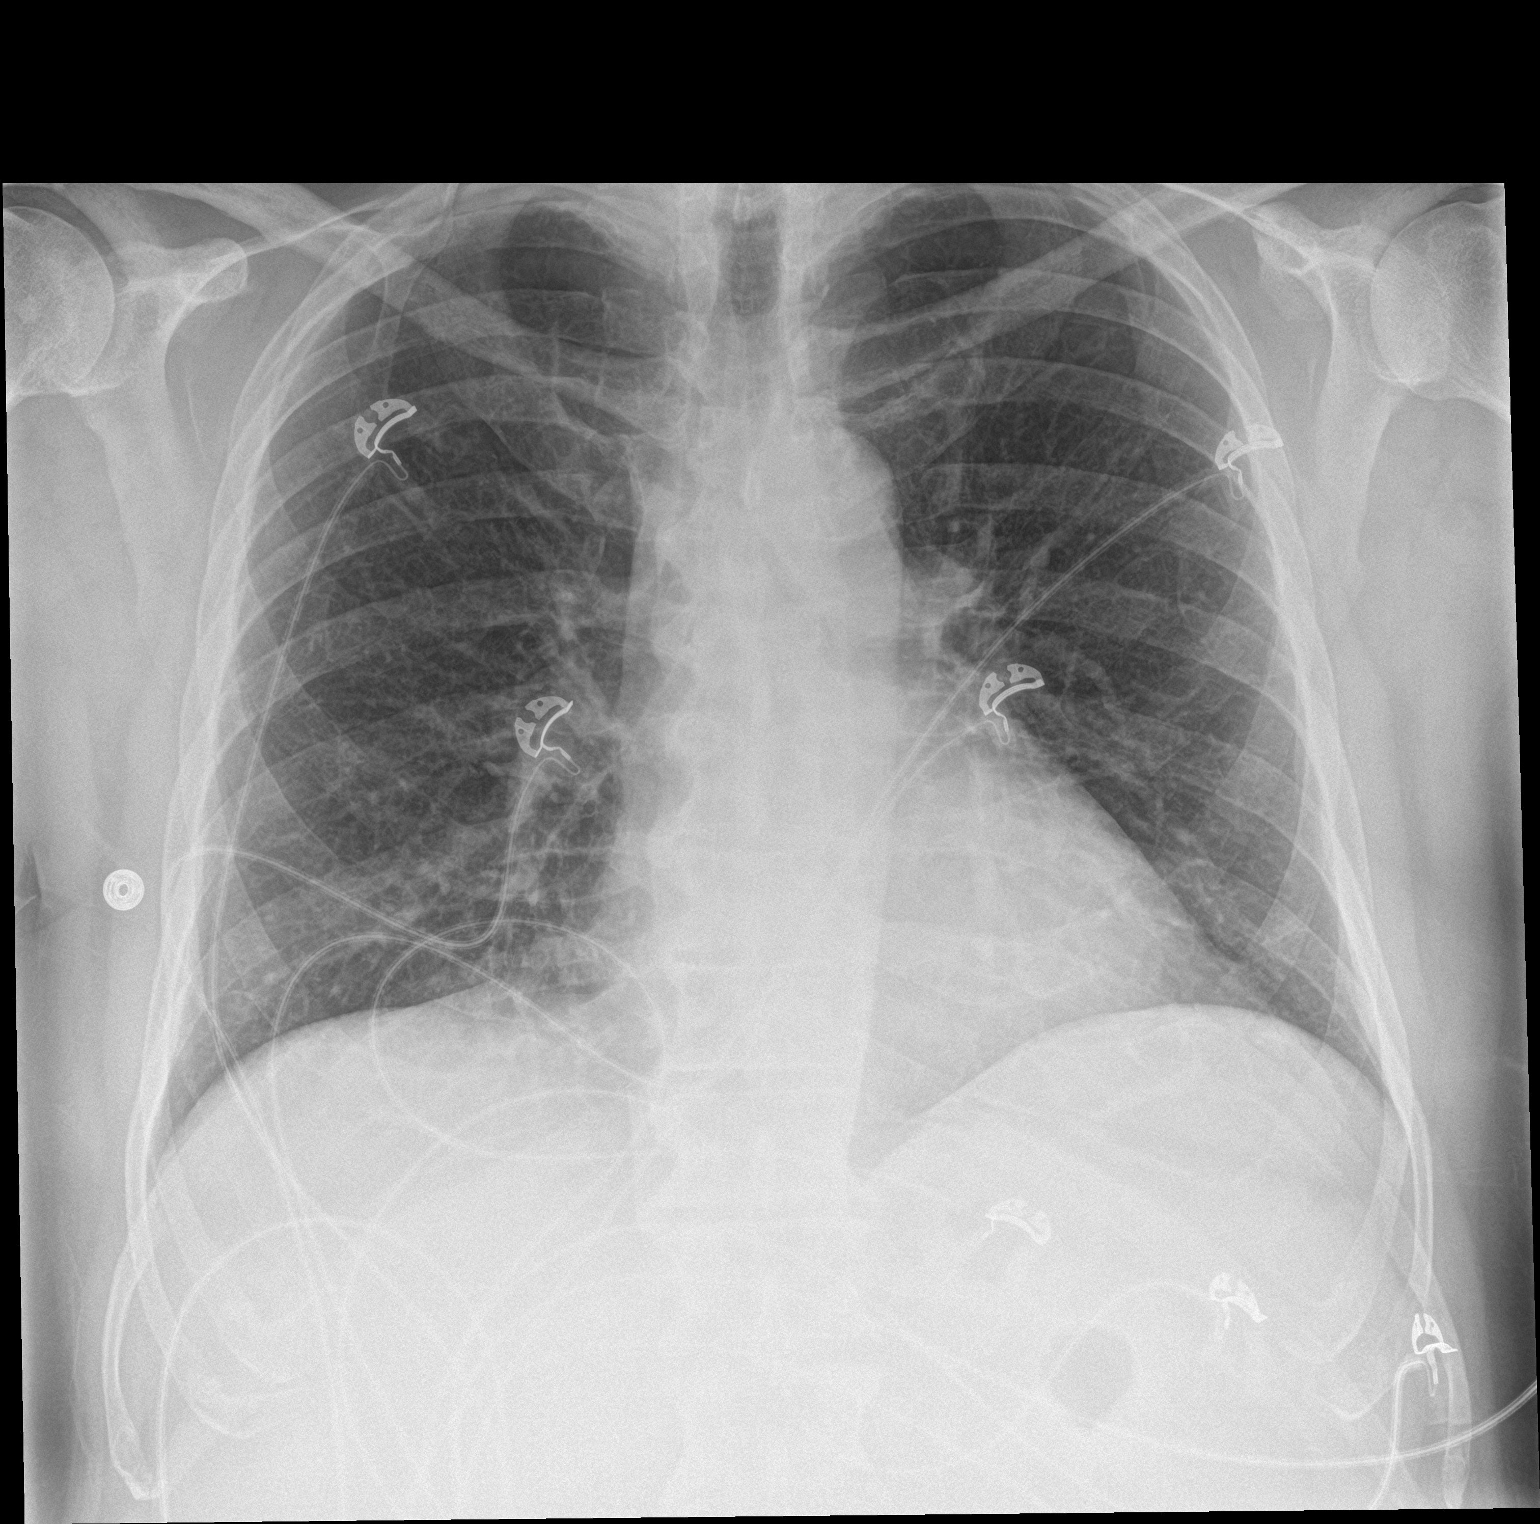

[chest lat]
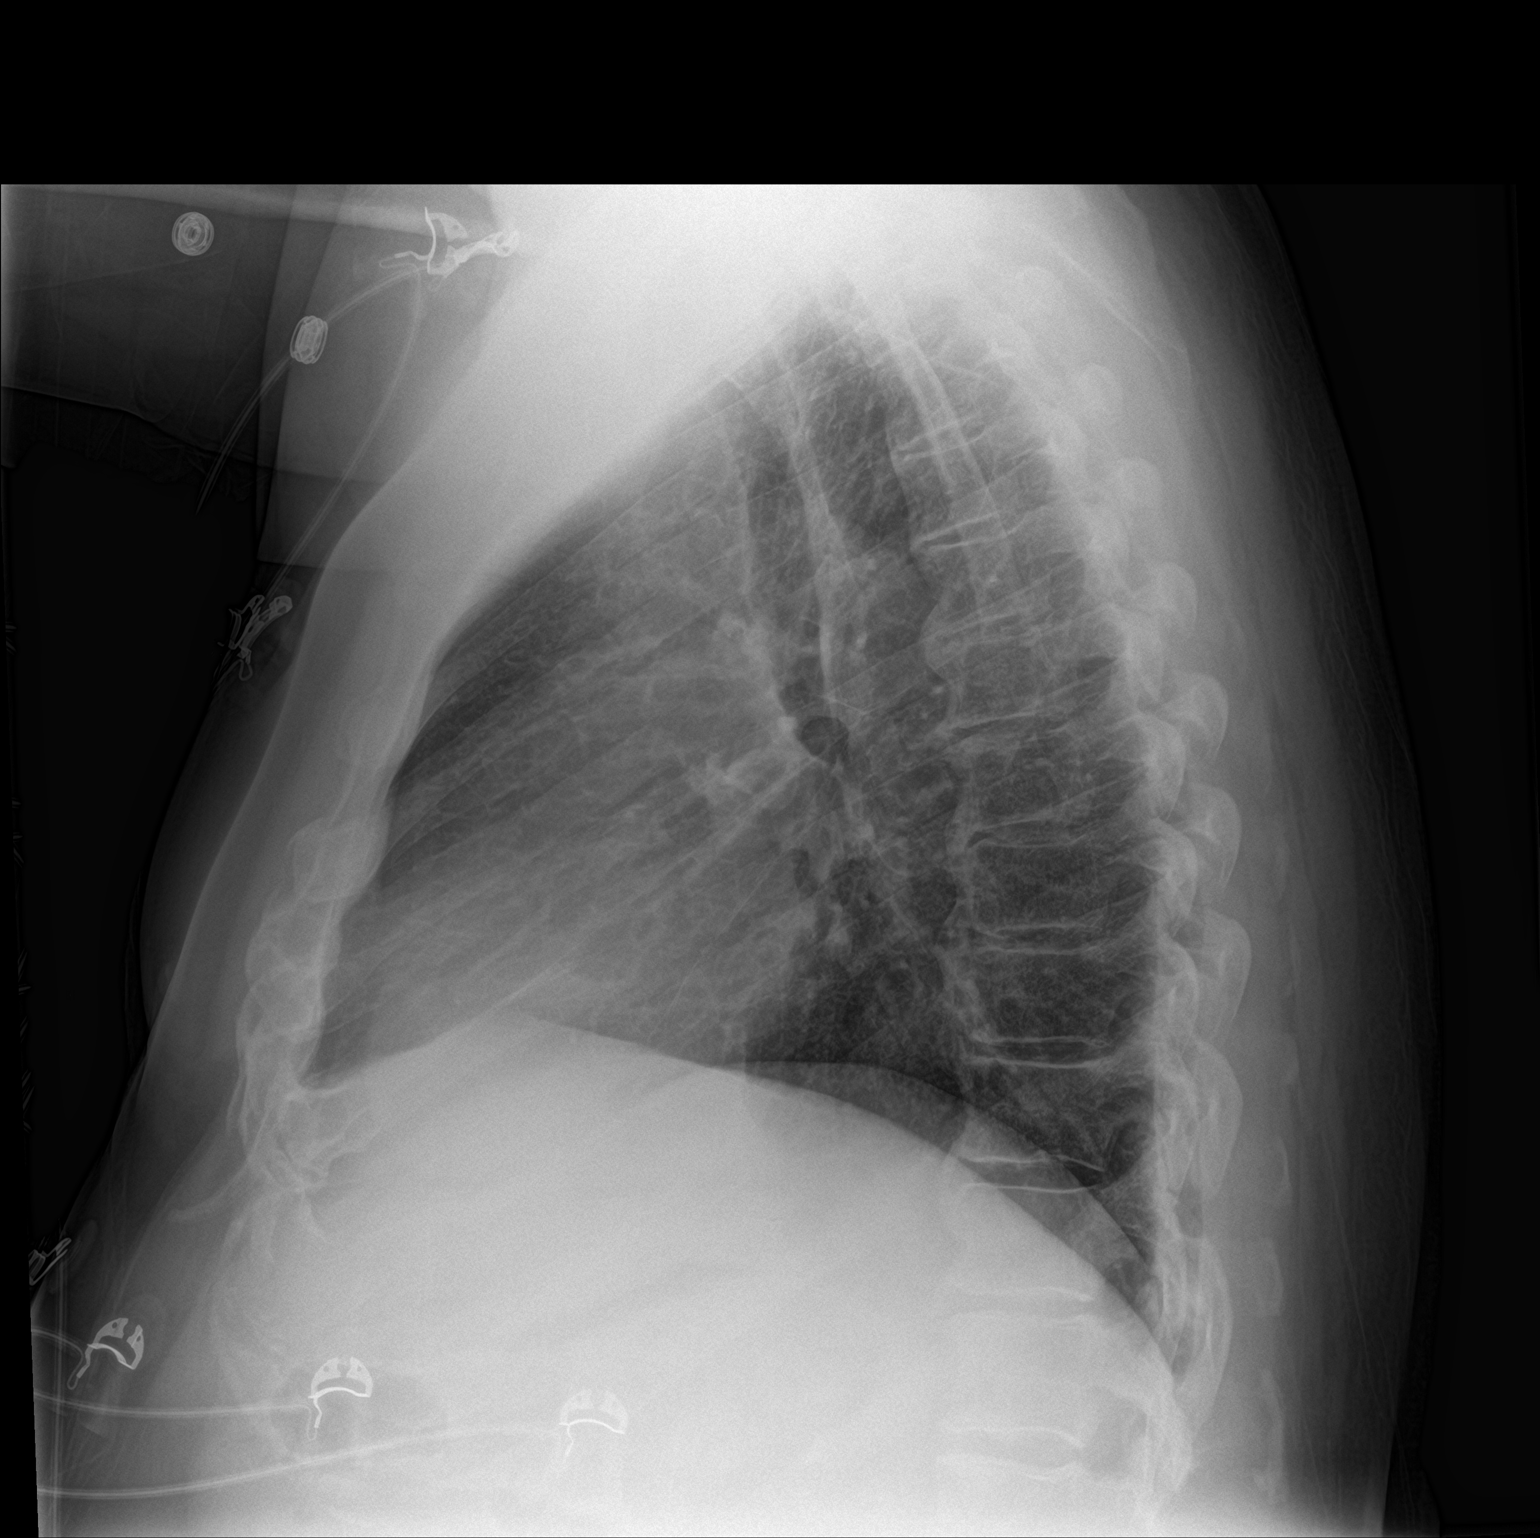

[2 of 2 positions shown; findings below may reference images not displayed]

FINDINGS: The heart size and mediastinal contours are within normal limits.
Both lungs are clear. The visualized skeletal structures are
unremarkable.
IMPRESSION: No active cardiopulmonary disease.

## 2021-02-11 DIAGNOSIS — E119 Type 2 diabetes mellitus without complications: Secondary | ICD-10-CM | POA: Diagnosis not present

## 2021-02-11 DIAGNOSIS — H2513 Age-related nuclear cataract, bilateral: Secondary | ICD-10-CM | POA: Diagnosis not present

## 2021-02-11 DIAGNOSIS — H35013 Changes in retinal vascular appearance, bilateral: Secondary | ICD-10-CM | POA: Diagnosis not present

## 2021-02-11 DIAGNOSIS — Z7984 Long term (current) use of oral hypoglycemic drugs: Secondary | ICD-10-CM | POA: Diagnosis not present

## 2021-02-11 DIAGNOSIS — H524 Presbyopia: Secondary | ICD-10-CM | POA: Diagnosis not present

## 2021-02-21 DIAGNOSIS — G894 Chronic pain syndrome: Secondary | ICD-10-CM | POA: Diagnosis not present

## 2021-02-21 DIAGNOSIS — E785 Hyperlipidemia, unspecified: Secondary | ICD-10-CM | POA: Diagnosis not present

## 2021-02-21 DIAGNOSIS — I10 Essential (primary) hypertension: Secondary | ICD-10-CM | POA: Diagnosis not present

## 2021-02-21 DIAGNOSIS — Z79891 Long term (current) use of opiate analgesic: Secondary | ICD-10-CM | POA: Diagnosis not present

## 2021-02-21 DIAGNOSIS — D510 Vitamin B12 deficiency anemia due to intrinsic factor deficiency: Secondary | ICD-10-CM | POA: Diagnosis not present

## 2021-02-21 DIAGNOSIS — F331 Major depressive disorder, recurrent, moderate: Secondary | ICD-10-CM | POA: Diagnosis not present

## 2021-02-21 DIAGNOSIS — F419 Anxiety disorder, unspecified: Secondary | ICD-10-CM | POA: Diagnosis not present

## 2021-02-21 DIAGNOSIS — E1142 Type 2 diabetes mellitus with diabetic polyneuropathy: Secondary | ICD-10-CM | POA: Diagnosis not present

## 2021-04-22 ENCOUNTER — Encounter (HOSPITAL_COMMUNITY): Payer: Self-pay | Admitting: Emergency Medicine

## 2021-04-22 ENCOUNTER — Emergency Department (HOSPITAL_COMMUNITY): Payer: Medicare HMO

## 2021-04-22 ENCOUNTER — Inpatient Hospital Stay (HOSPITAL_COMMUNITY)
Admission: EM | Admit: 2021-04-22 | Discharge: 2021-04-24 | DRG: 243 | Disposition: A | Discharge status: Home / Self Care | Payer: Medicare HMO | Attending: Cardiovascular Disease | Admitting: Cardiovascular Disease

## 2021-04-22 DIAGNOSIS — E669 Obesity, unspecified: Secondary | ICD-10-CM | POA: Diagnosis present

## 2021-04-22 DIAGNOSIS — I5022 Chronic systolic (congestive) heart failure: Secondary | ICD-10-CM

## 2021-04-22 DIAGNOSIS — Z7984 Long term (current) use of oral hypoglycemic drugs: Secondary | ICD-10-CM | POA: Diagnosis not present

## 2021-04-22 DIAGNOSIS — I428 Other cardiomyopathies: Secondary | ICD-10-CM | POA: Diagnosis present

## 2021-04-22 DIAGNOSIS — I10 Essential (primary) hypertension: Secondary | ICD-10-CM

## 2021-04-22 DIAGNOSIS — Z91048 Other nonmedicinal substance allergy status: Secondary | ICD-10-CM

## 2021-04-22 DIAGNOSIS — Z79899 Other long term (current) drug therapy: Secondary | ICD-10-CM | POA: Diagnosis not present

## 2021-04-22 DIAGNOSIS — Z885 Allergy status to narcotic agent status: Secondary | ICD-10-CM | POA: Diagnosis not present

## 2021-04-22 DIAGNOSIS — R5383 Other fatigue: Secondary | ICD-10-CM | POA: Diagnosis not present

## 2021-04-22 DIAGNOSIS — Z794 Long term (current) use of insulin: Secondary | ICD-10-CM | POA: Diagnosis not present

## 2021-04-22 DIAGNOSIS — I442 Atrioventricular block, complete: Principal | ICD-10-CM | POA: Diagnosis present

## 2021-04-22 DIAGNOSIS — Z8249 Family history of ischemic heart disease and other diseases of the circulatory system: Secondary | ICD-10-CM

## 2021-04-22 DIAGNOSIS — I700 Atherosclerosis of aorta: Secondary | ICD-10-CM | POA: Diagnosis not present

## 2021-04-22 DIAGNOSIS — Z87891 Personal history of nicotine dependence: Secondary | ICD-10-CM | POA: Diagnosis not present

## 2021-04-22 DIAGNOSIS — E119 Type 2 diabetes mellitus without complications: Secondary | ICD-10-CM | POA: Diagnosis not present

## 2021-04-22 DIAGNOSIS — Z6831 Body mass index (BMI) 31.0-31.9, adult: Secondary | ICD-10-CM | POA: Diagnosis not present

## 2021-04-22 DIAGNOSIS — Z95818 Presence of other cardiac implants and grafts: Secondary | ICD-10-CM

## 2021-04-22 DIAGNOSIS — I5020 Unspecified systolic (congestive) heart failure: Secondary | ICD-10-CM

## 2021-04-22 DIAGNOSIS — R0602 Shortness of breath: Secondary | ICD-10-CM | POA: Diagnosis not present

## 2021-04-22 DIAGNOSIS — R001 Bradycardia, unspecified: Secondary | ICD-10-CM | POA: Diagnosis present

## 2021-04-22 DIAGNOSIS — E785 Hyperlipidemia, unspecified: Secondary | ICD-10-CM | POA: Diagnosis present

## 2021-04-22 DIAGNOSIS — I110 Hypertensive heart disease with heart failure: Secondary | ICD-10-CM | POA: Diagnosis not present

## 2021-04-22 DIAGNOSIS — Z888 Allergy status to other drugs, medicaments and biological substances status: Secondary | ICD-10-CM

## 2021-04-22 DIAGNOSIS — I452 Bifascicular block: Secondary | ICD-10-CM | POA: Diagnosis present

## 2021-04-22 DIAGNOSIS — Z20822 Contact with and (suspected) exposure to covid-19: Secondary | ICD-10-CM | POA: Diagnosis not present

## 2021-04-22 DIAGNOSIS — R059 Cough, unspecified: Secondary | ICD-10-CM | POA: Diagnosis not present

## 2021-04-22 LAB — RESP PANEL BY RT-PCR (FLU A&B, COVID) ARPGX2
Influenza A by PCR: NEGATIVE
Influenza B by PCR: NEGATIVE
SARS Coronavirus 2 by RT PCR: NEGATIVE

## 2021-04-22 LAB — HEMOGLOBIN A1C
Hgb A1c MFr Bld: 6.3 % — ABNORMAL HIGH (ref 4.8–5.6)
Mean Plasma Glucose: 134.11 mg/dL

## 2021-04-22 LAB — COMPREHENSIVE METABOLIC PANEL
ALT: 27 U/L (ref 0–44)
AST: 23 U/L (ref 15–41)
Albumin: 4.1 g/dL (ref 3.5–5.0)
Alkaline Phosphatase: 70 U/L (ref 38–126)
Anion gap: 9 (ref 5–15)
BUN: 27 mg/dL — ABNORMAL HIGH (ref 8–23)
CO2: 22 mmol/L (ref 22–32)
Calcium: 9.6 mg/dL (ref 8.9–10.3)
Chloride: 106 mmol/L (ref 98–111)
Creatinine, Ser: 1.42 mg/dL — ABNORMAL HIGH (ref 0.61–1.24)
GFR, Estimated: 53 mL/min — ABNORMAL LOW (ref >60)
Glucose, Bld: 177 mg/dL — ABNORMAL HIGH (ref 70–99)
Potassium: 4.3 mmol/L (ref 3.5–5.1)
Sodium: 137 mmol/L (ref 135–145)
Total Bilirubin: 0.6 mg/dL (ref 0.3–1.2)
Total Protein: 7.2 g/dL (ref 6.5–8.1)

## 2021-04-22 LAB — CBC WITH DIFFERENTIAL/PLATELET
Abs Immature Granulocytes: 0.01 10*3/uL (ref 0.00–0.07)
Basophils Absolute: 0 10*3/uL (ref 0.0–0.1)
Basophils Relative: 1 %
Eosinophils Absolute: 0.1 10*3/uL (ref 0.0–0.5)
Eosinophils Relative: 2 %
HCT: 40.9 % (ref 39.0–52.0)
Hemoglobin: 14 g/dL (ref 13.0–17.0)
Immature Granulocytes: 0 %
Lymphocytes Relative: 33 %
Lymphs Abs: 2 10*3/uL (ref 0.7–4.0)
MCH: 30.4 pg (ref 26.0–34.0)
MCHC: 34.2 g/dL (ref 30.0–36.0)
MCV: 88.7 fL (ref 80.0–100.0)
Monocytes Absolute: 0.6 10*3/uL (ref 0.1–1.0)
Monocytes Relative: 10 %
Neutro Abs: 3.3 10*3/uL (ref 1.7–7.7)
Neutrophils Relative %: 54 %
Platelets: 157 10*3/uL (ref 150–400)
RBC: 4.61 MIL/uL (ref 4.22–5.81)
RDW: 14.6 % (ref 11.5–15.5)
WBC: 6 10*3/uL (ref 4.0–10.5)
nRBC: 0 % (ref 0.0–0.2)

## 2021-04-22 LAB — TROPONIN I (HIGH SENSITIVITY)
Troponin I (High Sensitivity): 16 ng/L (ref <18)
Troponin I (High Sensitivity): 15 ng/L (ref <18)

## 2021-04-22 LAB — GLUCOSE, CAPILLARY: Glucose-Capillary: 118 mg/dL — ABNORMAL HIGH (ref 70–99)

## 2021-04-22 LAB — BRAIN NATRIURETIC PEPTIDE: B Natriuretic Peptide: 753.9 pg/mL — ABNORMAL HIGH (ref 0.0–100.0)

## 2021-04-22 LAB — CBG MONITORING, ED: Glucose-Capillary: 116 mg/dL — ABNORMAL HIGH (ref 70–99)

## 2021-04-22 MED ORDER — ALPRAZOLAM 0.25 MG PO TABS: 0.2500 mg | ORAL_TABLET | Freq: Two times a day (BID) | ORAL | Status: DC | PRN

## 2021-04-22 MED ORDER — SODIUM CHLORIDE 0.45 % IV SOLN: INTRAVENOUS | Status: DC

## 2021-04-22 MED ORDER — INSULIN ASPART 100 UNIT/ML IJ SOLN: 0.0000 [IU] | Freq: Three times a day (TID) | INTRAMUSCULAR | Status: DC

## 2021-04-22 MED ORDER — NITROGLYCERIN 0.4 MG SL SUBL: 0.4000 mg | SUBLINGUAL_TABLET | SUBLINGUAL | Status: DC | PRN

## 2021-04-22 MED ORDER — CEFAZOLIN SODIUM-DEXTROSE 2-4 GM/100ML-% IV SOLN
2.0000 g | INTRAVENOUS | Status: AC
  Administered 2021-04-23: 2 g via INTRAVENOUS
  Filled 2021-04-22: qty 100

## 2021-04-22 MED ORDER — ATROPINE SULFATE 1 MG/10ML IJ SOSY: 0.5000 mg | PREFILLED_SYRINGE | Freq: Once | INTRAMUSCULAR | Status: DC | PRN

## 2021-04-22 MED ORDER — CHLORHEXIDINE GLUCONATE 4 % EX LIQD: 60.0000 mL | Freq: Once | CUTANEOUS | Status: DC

## 2021-04-22 MED ORDER — SODIUM CHLORIDE 0.9 % IV SOLN
80.0000 mg | INTRAVENOUS | Status: AC
  Administered 2021-04-23: 80 mg
  Filled 2021-04-22: qty 2

## 2021-04-22 MED ORDER — ATROPINE SULFATE 1 MG/10ML IJ SOSY
0.5000 mg | PREFILLED_SYRINGE | Freq: Once | INTRAMUSCULAR | Status: AC
  Administered 2021-04-22: 0.5 mg via INTRAVENOUS

## 2021-04-22 MED ORDER — SODIUM CHLORIDE 0.9 % IV SOLN: INTRAVENOUS | Status: DC

## 2021-04-22 MED ORDER — INSULIN GLARGINE 100 UNIT/ML ~~LOC~~ SOLN
10.0000 [IU] | Freq: Every day | SUBCUTANEOUS | Status: DC
  Administered 2021-04-22 – 2021-04-23 (×2): 10 [IU] via SUBCUTANEOUS
  Filled 2021-04-22 (×3): qty 0.1

## 2021-04-22 MED ORDER — MUPIROCIN 2 % EX OINT
1.0000 "application " | TOPICAL_OINTMENT | Freq: Two times a day (BID) | CUTANEOUS | Status: DC
  Administered 2021-04-22 – 2021-04-24 (×4): 1 via NASAL
  Filled 2021-04-22 (×2): qty 22

## 2021-04-22 MED ORDER — ZOLPIDEM TARTRATE 5 MG PO TABS: 5.0000 mg | ORAL_TABLET | Freq: Every evening | ORAL | Status: DC | PRN

## 2021-04-22 MED ORDER — HYDROMORPHONE HCL 2 MG PO TABS
4.0000 mg | ORAL_TABLET | Freq: Four times a day (QID) | ORAL | Status: DC | PRN
  Administered 2021-04-23 (×2): 4 mg via ORAL
  Filled 2021-04-22 (×2): qty 2

## 2021-04-22 MED ORDER — SODIUM CHLORIDE 0.9% FLUSH
3.0000 mL | Freq: Two times a day (BID) | INTRAVENOUS | Status: DC
  Administered 2021-04-22 – 2021-04-24 (×4): 3 mL via INTRAVENOUS

## 2021-04-22 MED ORDER — CHLORHEXIDINE GLUCONATE 4 % EX LIQD
60.0000 mL | Freq: Once | CUTANEOUS | Status: AC
  Administered 2021-04-22: 4 via TOPICAL
  Filled 2021-04-22: qty 60

## 2021-04-22 MED ORDER — ACETAMINOPHEN 325 MG PO TABS: 650.0000 mg | ORAL_TABLET | ORAL | Status: DC | PRN

## 2021-04-22 MED ORDER — ONDANSETRON HCL 4 MG/2ML IJ SOLN: 4.0000 mg | Freq: Four times a day (QID) | INTRAMUSCULAR | Status: DC | PRN

## 2021-04-22 MED ORDER — SODIUM CHLORIDE 0.9 % IV SOLN: 250.0000 mL | INTRAVENOUS | Status: DC | PRN

## 2021-04-22 MED ORDER — SODIUM CHLORIDE 0.9% FLUSH: 3.0000 mL | INTRAVENOUS | Status: DC | PRN

## 2021-04-22 MED ORDER — ENOXAPARIN SODIUM 40 MG/0.4ML IJ SOSY
40.0000 mg | PREFILLED_SYRINGE | INTRAMUSCULAR | Status: DC
  Administered 2021-04-22: 40 mg via SUBCUTANEOUS
  Filled 2021-04-22: qty 0.4

## 2021-04-22 NOTE — ED Provider Notes (Signed)
Emergency Medicine Provider Triage Evaluation Note  Brent Mendoza , a 70 y.o. male  was evaluated in triage.  Pt complains of fatigue, weakness and shortness of breath. States that for 3 days he has felt short of breath with any exertion. States that he tried to relax by laying down in bed however this did not improve symptoms. He states he feels like "theres fluid all in my chest." Wife states she took his blood pressure at home and noted his heart rate in the 30s. She called PCP who instructed her to present to ED. No history of bradycardia. He denies fever, chest pain, palpiations, syncope, lightheadedness or dizziness.   Review of Systems  Positive: Shortness of breath  Negative: syncope  Physical Exam  BP (!) 154/53 (BP Location: Right Arm)   Pulse (!) 33   Temp 97.7 F (36.5 C) (Oral)   Resp 18   Ht 6' (1.829 m)   Wt 104.3 kg   SpO2 100%   BMI 31.19 kg/m  Gen:   Awake, no distress   Resp:  Normal effort  MSK:   Moves extremities without difficulty  Other:    Medical Decision Making  Medically screening exam initiated at 2:50 PM.  Appropriate orders placed.  Brent Mendoza was informed that the remainder of the evaluation will be completed by another provider, this initial triage assessment does not replace that evaluation, and the importance of remaining in the ED until their evaluation is complete.  Physical exam differed in order to quickly room patient. HR 31 in triage with shortness of breath. EKG With complete heart block. He was immediately roomed to 69. I spoke with Margarita Mail, PA-C to ensure provider team knows he is being roomed.    Mickie Hillier, PA-C 04/22/21 1453    Lucrezia Starch, MD 04/23/21 [phone]

## 2021-04-22 NOTE — ED Triage Notes (Signed)
Patient presents with his wife- she states patient has felt bad x 3 days. Reports having labored breathing even worse with walking and fatigue.

## 2021-04-22 NOTE — H&P (Signed)
Cardiology Admission History and Physical:   Patient ID: Brent Mendoza MRN: [id number]; DOB: 03-25-1951   Admission date: 04/22/2021  PCP:  Nicoletta Dress, MD   [facility] Providers Cardiologist:  Evalina Field, MD       Chief Complaint:  CHB  Patient Profile:   Brent Mendoza is a 70 y.o. male with HFrEF, DM, HTN, HLD, LBBB, syncope 2nd BB, who is being seen 04/22/2021 for the evaluation of complete heart block.  History of Present Illness:   [name] was admitted 03/2020 with syncope in setting of COVID, on Coreg 3.125 mg bid, AKI. HR 38 at one point. D/c'd off BB. Did not f/u with Cards after that.  Patient heart rate improved off the beta-blocker and was in the 60s and 70s prior to discharge.  He has a long history of occasional orthostatic dizziness.  He has never checked his blood pressure during these episodes and does not know what is going on with that.  He has not lost consciousness again.  He does not check his blood pressure regularly.  For the last couple of days, he has noticed extreme fatigue and dyspnea on exertion.  Normally, he would be able to do yard work for several hours.  In the last couple of days, he would feel exhausted after 20 to 30 minutes and have to lie down for a couple of hours.  He could not walk more than about 30 feet without stopping and just feeling like he was gasping for breath.  He had no chest pain.  He has not had any palpitations.  He denies waking with lower extremity edema, he denies orthopnea or pulm edema.  Today, his wife was concerned about his condition and took his blood pressure.  She noticed his heart rate was in the low 30s.  She repeated his blood pressure and his heart rate was still in the 30s.  That is when she decided that they should come to the emergency room.  In the emergency room, his heart rate was in the low 30s and his dropped as low as 29.  The rhythm is complete heart block.  He had 0.5 mg of  atropine without any improvement in his heart rate.  Currently, he is resting comfortably as long as he does not get up and move around.   Past Medical History:  Diagnosis Date   CHF (congestive heart failure) (HCC)    Diabetes mellitus without complication ([place])    Hyperlipidemia    Hypertension     Past Surgical History:  Procedure Laterality Date   ABDOMINAL SURGERY     LEG SURGERY       Medications Prior to Admission: Prior to Admission medications   Medication Sig Start Date End Date Taking? Authorizing Provider  amLODipine (NORVASC) 2.5 MG tablet Take 1 tablet (2.5 mg total) by mouth daily. 04/07/20   Elgergawy, Kolker Huguenin, MD  atorvastatin (LIPITOR) 80 MG tablet Take 80 mg by mouth daily at 6 PM.  09/28/16   [provider]  Empagliflozin (JARDIANCE PO) Take 1 tablet by mouth every morning.    [provider]  escitalopram (LEXAPRO) 20 MG tablet Take 20 mg by mouth daily.  09/28/16   [provider]  esomeprazole (NEXIUM) 20 MG capsule Take 20 mg by mouth daily.  07/21/16   [provider]  gabapentin (NEURONTIN) 400 MG capsule Take 1,200 mg by mouth 3 (three) times daily as needed (pain).  07/24/16  [provider]  HYDROmorphone (DILAUDID) 4 MG tablet Take 4 mg by mouth every 6 (six) hours as needed for severe pain.  10/05/16   [provider]  insulin glargine (LANTUS SOLOSTAR) 100 UNIT/ML Solostar Pen Inject 14 Units into the skin at bedtime. 04/06/20   Elgergawy, Davisson Huguenin, MD  Omega-3 Fatty Acids (FISH OIL) 1200 MG CAPS Take 1 capsule (1,200 mg total) by mouth daily. 07/22/19   Benay Pike, MD  rOPINIRole (REQUIP) 2 MG tablet Take 4 mg by mouth at bedtime. Restless leg 03/18/10   [provider]     Allergies:    Allergies  Allergen Reactions   Promethazine     Other reaction(s): Cardiovascular Arrest (ALLERGY/intolerance) CODED IN PAST DUE TO ADMINISTRATION   Adhesive [Tape]    Codeine     Other  reaction(s): Other (See Comments) Vomiting and headaches    Social History:   Social History   Socioeconomic History   Marital status: Married    Spouse name: Patsy   Number of children: Not on file   Years of education: Not on file   Highest education level: Not on file  Occupational History   Occupation: truck driver  Tobacco Use   Smoking status: Former    Years: 13.00    Types: Cigarettes   Smokeless tobacco: Never  Scientific laboratory technician Use: Never used  Substance and Sexual Activity   Alcohol use: No   Drug use: Never   Sexual activity: Not on file  Other Topics Concern   Not on file  Social History Narrative   Not on file   Social Determinants of Health   Financial Resource Strain: Not on file  Food Insecurity: Not on file  Transportation Needs: Not on file  Physical Activity: Not on file  Stress: Not on file  Social Connections: Not on file  Intimate Partner Violence: Not on file    Family History:   The patient's family history includes Heart disease in his father and mother; Stroke in his brother.    ROS:  Please see the history of present illness.  All other ROS reviewed and negative.     Physical Exam/Data:   Vitals:   04/22/21 1433 04/22/21 1440 04/22/21 1545  BP: (!) 154/53  (!) 143/52  Pulse: (!) 33  (!) 35  Resp: 18  15  Temp: 97.7 F (36.5 C)    TempSrc: Oral    SpO2: 100%  100%  Weight:  104.3 kg   Height:  6' (1.829 m)    No intake or output data in the 24 hours ending 04/22/21 1600 Last 3 Weights 04/22/2021 04/06/2020 04/05/2020  Weight (lbs) 230 lb 236 lb 5.3 oz 235 lb  Weight (kg) 104.327 kg 107.2 kg 106.595 kg     Body mass index is 31.19 kg/m.  General:  Well nourished, well developed, in no acute distress HEENT: normal Neck: minimal JVD Vascular: No carotid bruits; Distal pulses 2+ bilaterally   Cardiac:  normal S1, S2; slow but RRR; no murmur  Lungs:  clear to auscultation bilaterally, no wheezing, rhonchi or rales  Abd:  soft, nontender, no hepatomegaly  Ext: no edema Musculoskeletal:  No deformities, BUE and BLE strength weak but equal Skin: warm and dry, all incisions are well-healed Neuro:  CNs 2-12 intact, no focal abnormalities noted Psych:  Normal affect    EKG:  The ECG that was done today was personally reviewed and demonstrates CHB, RBBB (prev had LBBB),  HR 33, LAFB  Relevant CV Studies:  ECHO: 04/06/2020  1. Left ventricular ejection fraction, by estimation, is 45 to 50%. The  left ventricle has mildly decreased function. The left ventricle  demonstrates global hypokinesis. There is mild concentric left ventricular  hypertrophy. Left ventricular diastolic parameters are indeterminate.   2. Right ventricular systolic function is normal. The right ventricular  size is normal. Tricuspid regurgitation signal is inadequate for assessing  PA pressure.   3. The mitral valve is normal in structure. Trivial mitral valve  regurgitation. No evidence of mitral stenosis.   4. The aortic valve is tricuspid. There is mild calcification of the  aortic valve. There is mild thickening of the aortic valve. Aortic valve  regurgitation is not visualized. Mild aortic valve sclerosis is present,  with no evidence of aortic valve  stenosis.   5. The inferior vena cava is dilated in size with >50% respiratory  variability, suggesting right atrial pressure of 8 mmHg.   MYOVIEW: 08/10/2019  Nuclear stress EF: 42%. The left ventricular ejection fraction is moderately decreased (30-44%). There was no ST segment deviation noted during stress. This is an intermediate risk study. The study is normal.    Laboratory Data:  High Sensitivity Troponin:  No results for input(s): TROPONINIHS in the last 720 hours.    ChemistryNo results for input(s): NA, K, CL, CO2, GLUCOSE, BUN, CREATININE, CALCIUM, MG, GFRNONAA, GFRAA, ANIONGAP in the last 168 hours.  No results for input(s): PROT, ALBUMIN, AST, ALT, ALKPHOS, BILITOT  in the last 168 hours. Lipids No results for input(s): CHOL, TRIG, HDL, LABVLDL, LDLCALC, CHOLHDL in the last 168 hours. Hematology Recent Labs  Lab 04/22/21 1444  WBC 6.0  RBC 4.61  HGB 14.0  HCT 40.9  MCV 88.7  MCH 30.4  MCHC 34.2  RDW 14.6  PLT 157   Lab Results  Component Value Date   HGBA1C 10.6 (H) 04/06/2020    Thyroid  Lab Results  Component Value Date   TSH 4.609 (H) 07/21/2019   BNPNo results for input(s): BNP, PROBNP in the last 168 hours.  DDimer No results for input(s): DDIMER in the last 168 hours.   Radiology/Studies:  DG Chest Portable 1 View  Result Date: 04/22/2021 CLINICAL DATA:  70 year old male with history of shortness of breath, bradycardia and cough. EXAM: PORTABLE CHEST 1 VIEW COMPARISON:  Chest x-ray 04/05/2020. FINDINGS: Lung volumes are normal. No consolidative airspace disease. No pleural effusions. No pneumothorax. No pulmonary nodule or mass noted. Pulmonary vasculature and the cardiomediastinal silhouette are within normal limits. Atherosclerosis in the thoracic aorta. IMPRESSION: 1.  No radiographic evidence of acute cardiopulmonary disease. 2. Aortic atherosclerosis. Electronically Signed   By: Vinnie Langton M.D.   On: 04/22/2021 15:27     Assessment and Plan:   CHB -Seen in the setting of no rate lowering medications. -Had syncope a year ago, in the setting of COVID and on a low-dose of beta-blocker.  Heart rate improved prior to discharge -Previously had a left bundle, now with a right bundle, QRS duration 130 ms, also with LAFB -No inciting illness - EP contacted, they will see - PPM is indicated, pt and wife aware.    Risk Assessment/Risk Scores:      New York Heart Association (NYHA) Functional Class NYHA Class III    Severity of Illness: The appropriate patient status for this patient is INPATIENT. Inpatient status is judged to be reasonable and necessary in order to provide the required intensity of service  to  ensure the patient's safety. The patient's presenting symptoms, physical exam findings, and initial radiographic and laboratory data in the context of their chronic comorbidities is felt to place them at high risk for further clinical deterioration. Furthermore, it is not anticipated that the patient will be medically stable for discharge from the hospital within 2 midnights of admission.   * I certify that at the point of admission it is my clinical judgment that the patient will require inpatient hospital care spanning beyond 2 midnights from the point of admission due to high intensity of service, high risk for further deterioration and high frequency of surveillance required.*   For questions or updates, please contact [place] Please consult [email or web address] for contact info under     Signed, Rosaria Ferries, PA-C  04/22/2021 4:00 PM

## 2021-04-22 NOTE — Consult Note (Signed)
ELECTROPHYSIOLOGY CONSULT NOTE    Primary Care Physician: Nicoletta Dress, MD Referring Physician:  Dr Marisue Ivan  Admit Date: 04/22/2021  Reason for consultation:  AV block  Brent Mendoza is a 70 y.o. male with a h/o longstanding conduction system disease who is now admitted with symptomatic complete heart block. He presented in 03/2020 with AV block in the setting of covid.  His coreg was discontinued and he was discharged.  Though he has done reasonably well, he has had intermittent symptoms of complete block over the past few months. His reports that more prominently he has had SOB and dizziness for the past 3 days.  He reports substantial reduction in exercise tolerance.  His wife took his Bp and noted today that his HR was 30s.  He was brought to the ER where he was confirmed to have CHB.  He has been evaluated by general cardiology with echo pending.  He is currently hemodynamically stable and mentating well.  He reports no presyncope or syncope.     Past Medical History:  Diagnosis Date   CHF (congestive heart failure) (HCC)    Diabetes mellitus without complication (HCC)    Hyperlipidemia    Hypertension    Past Surgical History:  Procedure Laterality Date   ABDOMINAL SURGERY     LEG SURGERY       enoxaparin (LOVENOX) injection  40 mg Subcutaneous Q24H   insulin aspart  0-15 Units Subcutaneous TID WC   insulin glargine  10 Units Subcutaneous QHS   sodium chloride flush  3 mL Intravenous Q12H    sodium chloride      Allergies  Allergen Reactions   Promethazine     Other reaction(s): Cardiovascular Arrest (ALLERGY/intolerance) CODED IN PAST DUE TO ADMINISTRATION   Adhesive [Tape]    Codeine     Other reaction(s): Other (See Comments) Vomiting and headaches    Social History   Socioeconomic History   Marital status: Married    Spouse name: Patsy   Number of children: Not on file   Years of education: Not on file   Highest education level: Not on file   Occupational History   Occupation: truck driver  Tobacco Use   Smoking status: Former    Years: 13.00    Types: Cigarettes   Smokeless tobacco: Never  Scientific laboratory technician Use: Never used  Substance and Sexual Activity   Alcohol use: No   Drug use: Never   Sexual activity: Not on file  Other Topics Concern   Not on file  Social History Narrative   Not on file   Social Determinants of Health   Financial Resource Strain: Not on file  Food Insecurity: Not on file  Transportation Needs: Not on file  Physical Activity: Not on file  Stress: Not on file  Social Connections: Not on file  Intimate Partner Violence: Not on file    Family History  Problem Relation Age of Onset   Heart disease Mother    Heart disease Father    Stroke Brother     ROS- All systems are reviewed and negative except as per the HPI above  Physical Exam: Telemetry:  sinus with complete heart block Vitals:   04/22/21 1615 04/22/21 1630 04/22/21 1645 04/22/21 1700  BP: 137/61 105/87 (!) 142/49 (!) 139/52  Pulse: (!) 33 (!) 31 (!) 31 (!) 30  Resp: 14 14 15 16   Temp:      TempSrc:      SpO2:  100% 100% 100% 100%  Weight:      Height:        GEN- The patient is well appearing, alert and oriented x 3 today.   Head- normocephalic, atraumatic Eyes-  Sclera clear, conjunctiva pink Ears- hearing intact Oropharynx- clear Neck- supple, no JVP Lungs- Clear to ausculation bilaterally, normal work of breathing Heart- bradycardic regular rhythm GI- soft  Extremities- no clubbing, cyanosis, or edema MS- no significant deformity or atrophy Skin- no rash or lesion Psych- euthymic mood, full affect Neuro- strength and sensation are intact  EKG-  sinus with complete heart block, rbb escape.  Prior ecgs show sinus with first degree AV block and LBBB  Echo 04/06/20-  EF 45% with global HK  Labs:   Lab Results  Component Value Date   WBC 6.0 04/22/2021   HGB 14.0 04/22/2021   HCT 40.9 04/22/2021    MCV 88.7 04/22/2021   PLT 157 04/22/2021    Recent Labs  Lab 04/22/21 1444  NA 137  K 4.3  CL 106  CO2 22  BUN 27*  CREATININE 1.42*  CALCIUM 9.6  PROT 7.2  BILITOT 0.6  ALKPHOS 70  ALT 27  AST 23  GLUCOSE 177*     ASSESSMENT AND PLAN:   Complete heart block He has chronic conduction system disease.  He now presents with complete heart block.  He is clinically stable.  There are no reversible causes and therefore PPM implant is advised.  We will obtain an echo given prior EF of 45% to determine whether conduction system patient or CRT-P would be more appropriate.  Risks, benefits, alternatives to pacemaker implantation were discussed in detail with the patient today. The patient understands that the risks include but are not limited to bleeding, infection, pneumothorax, perforation, tamponade, vascular damage, renal failure, MI, stroke, death,  and lead dislodgement and wishes to proceed.  I will make NPO after breakfast tomorrow.  Hopefully Dr Macky Lower schedule will allow for PPM implant after echo is performed.  If not, the patient appears stable for pacing on Thursday.  EP to follow closely with you while here.  Thompson Grayer, MD 04/22/2021  7:37 PM

## 2021-04-22 NOTE — ED Provider Notes (Signed)
[facility] EMERGENCY DEPARTMENT Provider Note   CSN: [id number] Arrival date & time: 04/22/21  1427     History Chief Complaint  Patient presents with   Shortness of Breath    Brent Mendoza is a 70 y.o. male.   Shortness of Breath  Patient presented to the ED for evaluation fatigue and low heart rate.  States the last couple of days he has felt very fatigued.  He also has been getting short of breath more so when he lies down on his left side.  He initially did not think too much of it.  Was not too severe but it has been getting worse.  He has not been coughing.  He has not been have any fevers or chills.  He denies any chest pain.  Patient had another episode today where he is feeling even more short of breath just with minimal exertion.  He had his wife check his blood pressure and the monitor said his heart rate was in the 30s.  Patient states he had a episode in the past where he had a passing out spell and his heart rate was low but it was related to an episode of covid.  He was also on beta-blockers at that time according to the medical records and they took him off that medication.  Past Medical History:  Diagnosis Date   CHF (congestive heart failure) ([place])    Diabetes mellitus without complication ([place])    Hyperlipidemia    Hypertension     Patient Active Problem List   Diagnosis Date Noted   Syncope 04/05/2020   Elevated TSH 07/22/2019   DM (diabetes mellitus), type 2, uncontrolled 07/22/2019   SOB (shortness of breath) 07/21/2019   Depression 02/28/2013   Chronic otitis media 10/23/2011   Eustachian tube dysfunction 10/23/2011   Mixed hearing loss 10/23/2011   Erythrocytosis 07/14/2011   Ear cysts 03/31/2011   APL (acute promyelocytic leukemia) in remission ([place]) 04/15/2002    Past Surgical History:  Procedure Laterality Date   ABDOMINAL SURGERY     LEG SURGERY         Family History  Problem Relation Age of Onset   Heart disease  Mother    Heart disease Father    Stroke Brother     Social History   Tobacco Use   Smoking status: Former    Years: 13.00    Types: Cigarettes   Smokeless tobacco: Never  Vaping Use   Vaping Use: Never used  Substance Use Topics   Alcohol use: No   Drug use: Never    Home Medications Prior to Admission medications   Medication Sig Start Date End Date Taking? Authorizing Provider  amLODipine (NORVASC) 2.5 MG tablet Take 1 tablet (2.5 mg total) by mouth daily. 04/07/20   Elgergawy, Lumpkin Huguenin, MD  atorvastatin (LIPITOR) 80 MG tablet Take 80 mg by mouth daily at 6 PM.  09/28/16   [provider]  Empagliflozin (JARDIANCE PO) Take 1 tablet by mouth every morning.    [provider]  escitalopram (LEXAPRO) 20 MG tablet Take 20 mg by mouth daily.  09/28/16   [provider]  esomeprazole (NEXIUM) 20 MG capsule Take 20 mg by mouth daily.  07/21/16   [provider]  gabapentin (NEURONTIN) 400 MG capsule Take 1,200 mg by mouth 3 (three) times daily as needed (pain).  07/24/16   [provider]  HYDROmorphone (DILAUDID) 4 MG tablet Take 4 mg by mouth  every 6 (six) hours as needed for severe pain.  10/05/16   [provider]  insulin glargine (LANTUS SOLOSTAR) 100 UNIT/ML Solostar Pen Inject 14 Units into the skin at bedtime. 04/06/20   Elgergawy, Arca Huguenin, MD  Omega-3 Fatty Acids (FISH OIL) 1200 MG CAPS Take 1 capsule (1,200 mg total) by mouth daily. 07/22/19   Benay Pike, MD  rOPINIRole (REQUIP) 2 MG tablet Take 4 mg by mouth at bedtime. Restless leg 03/18/10   [provider]    Allergies    Promethazine, Adhesive [tape], and Codeine  Review of Systems   Review of Systems  Respiratory:  Positive for shortness of breath.   All other systems reviewed and are negative.  Physical Exam Updated Vital Signs BP (!) 143/52   Pulse (!) 35   Temp 97.7 F (36.5 C) (Oral)   Resp 15   Ht 1.829 m (6')   Wt 104.3 kg   SpO2 100%    BMI 31.19 kg/m   Physical Exam Vitals and nursing note reviewed.  Constitutional:      General: He is not in acute distress.    Appearance: He is well-developed.  HENT:     Head: Normocephalic and atraumatic.     Right Ear: External ear normal.     Left Ear: External ear normal.  Eyes:     General: No scleral icterus.       Right eye: No discharge.        Left eye: No discharge.     Conjunctiva/sclera: Conjunctivae normal.  Neck:     Trachea: No tracheal deviation.  Cardiovascular:     Rate and Rhythm: Regular rhythm. Bradycardia present.  Pulmonary:     Effort: Pulmonary effort is normal. No respiratory distress.     Breath sounds: Normal breath sounds. No stridor. No wheezing or rales.  Abdominal:     General: Bowel sounds are normal. There is no distension.     Palpations: Abdomen is soft.     Tenderness: There is no abdominal tenderness. There is no guarding or rebound.  Musculoskeletal:        General: No tenderness or deformity.     Cervical back: Neck supple.  Skin:    General: Skin is warm and dry.     Findings: No rash.  Neurological:     General: No focal deficit present.     Mental Status: He is alert.     Cranial Nerves: No cranial nerve deficit (no facial droop, extraocular movements intact, no slurred speech).     Sensory: No sensory deficit.     Motor: No abnormal muscle tone or seizure activity.     Coordination: Coordination normal.  Psychiatric:        Mood and Affect: Mood normal.    ED Results / Procedures / Treatments   Labs (all labs ordered are listed, but only abnormal results are displayed) Labs Reviewed  COMPREHENSIVE METABOLIC PANEL - Abnormal; Notable for the following components:      Result Value   Glucose, Bld 177 (*)    BUN 27 (*)    Creatinine, Ser 1.42 (*)    GFR, Estimated 53 (*)    All other components within normal limits  BRAIN NATRIURETIC PEPTIDE - Abnormal; Notable for the following components:   B Natriuretic Peptide  753.9 (*)    All other components within normal limits  RESP PANEL BY RT-PCR (FLU A&B, COVID) ARPGX2  CBC WITH DIFFERENTIAL/PLATELET  TSH  HEMOGLOBIN A1C  TROPONIN I (HIGH SENSITIVITY)  TROPONIN I (HIGH SENSITIVITY)    EKG EKG Interpretation  Date/Time:  Tuesday April 22 2021 14:43:48 EST Ventricular Rate:  33 PR Interval:    QRS Duration: 130 QT Interval:  688 QTC Calculation: 509 R Axis:   -26 Text Interpretation: Complete (3-degree) AV block Septal infarct , age undetermined T wave abnormality, consider lateral ischemia Abnormal ECG Confirmed by Dorie Rank [phone]) on 04/22/2021 3:25:45 PM  Radiology DG Chest Portable 1 View  Result Date: 04/22/2021 CLINICAL DATA:  70 year old male with history of shortness of breath, bradycardia and cough. EXAM: PORTABLE CHEST 1 VIEW COMPARISON:  Chest x-ray 04/05/2020. FINDINGS: Lung volumes are normal. No consolidative airspace disease. No pleural effusions. No pneumothorax. No pulmonary nodule or mass noted. Pulmonary vasculature and the cardiomediastinal silhouette are within normal limits. Atherosclerosis in the thoracic aorta. IMPRESSION: 1.  No radiographic evidence of acute cardiopulmonary disease. 2. Aortic atherosclerosis. Electronically Signed   By: Vinnie Langton M.D.   On: 04/22/2021 15:27    Procedures .Critical Care Performed by: Dorie Rank, MD Authorized by: Dorie Rank, MD   Critical care provider statement:    Critical care time (minutes):  30   Critical care was time spent personally by me on the following activities:  Discussions with consultants, evaluation of patient's response to treatment, examination of patient, ordering and review of laboratory studies, ordering and review of radiographic studies and re-evaluation of patient's condition   Care discussed with: admitting provider     Medications Ordered in ED Medications  insulin glargine (LANTUS) Solostar Pen 10 Units (has no administration in time range)   insulin aspart (novoLOG) injection 0-15 Units (has no administration in time range)  atropine 1 MG/10ML injection 0.5 mg (has no administration in time range)  atropine 1 MG/10ML injection 0.5 mg (0.5 mg Intravenous Given 04/22/21 1522)    ED Course  I have reviewed the triage vital signs and the nursing notes.  Pertinent labs & imaging results that were available during my care of the patient were reviewed by me and considered in my medical decision making (see chart for details).  Clinical Course as of 04/22/21 1622  Tue Apr 22, 2021  1510 Patient has remained stable in the ED.  No complaints [JK]  1523 EKG reviewed.  Appears to be complete heart block [JK]  1525 Case discussed with cardiology.  They will come to evaluate him [JK]  1544 CBC with Differential Normal [JK]  1616 BNP the elevated at 735 [JK]  1617 Comprehensive metabolic panel(!) Creatinine increased compared to 1 year ago [JK]  1617 Chest x-ray without signs of infiltrate or edema [JK]    Clinical Course User Index [JK] Dorie Rank, MD   MDM Rules/Calculators/A&P                           Patient presented to the ED for evaluation of bradycardia.  Patient had noticed that he was very fatigued the last few days.  In terms had been increasing and with any activity he was getting extremely short of breath and fatigue.  Today his wife decided to check his blood pressure during 1 of those episodes and his heart rate was in the 30s.  In the ED the patient appears to have a complete heart block on his EKG.  He is asymptomatic as long he is he is resting.  Patient was given a dose of atropine without any  significant improvement.  Patient has a normal blood pressure.  External pacer pads were applied but we will continue to monitor and at this time he does not require any immediate pacing.  I have consulted with cardiology and they have evaluated in the ED.  Plan is admission to the hospital for pacemaker implantation Final  Clinical Impression(s) / ED Diagnoses Final diagnoses:  Complete heart block (HCC)     Dorie Rank, MD 04/22/21 1622

## 2021-04-22 NOTE — Progress Notes (Signed)
   Recent Labs from his PCP.  Rosaria Ferries, PA-C 04/22/2021 4:22 PM

## 2021-04-23 ENCOUNTER — Other Ambulatory Visit: Payer: Self-pay

## 2021-04-23 ENCOUNTER — Encounter (HOSPITAL_COMMUNITY): Payer: Self-pay | Admitting: Cardiovascular Disease

## 2021-04-23 ENCOUNTER — Inpatient Hospital Stay (HOSPITAL_COMMUNITY)
Admission: EM | Disposition: A | Discharge status: Home / Self Care | Payer: Self-pay | Source: Home / Self Care | Attending: Cardiovascular Disease

## 2021-04-23 ENCOUNTER — Inpatient Hospital Stay (HOSPITAL_COMMUNITY): Payer: Medicare HMO

## 2021-04-23 DIAGNOSIS — I442 Atrioventricular block, complete: Secondary | ICD-10-CM

## 2021-04-23 HISTORY — PX: PACEMAKER IMPLANT: EP1218

## 2021-04-23 LAB — GLUCOSE, CAPILLARY
Glucose-Capillary: 119 mg/dL — ABNORMAL HIGH (ref 70–99)
Glucose-Capillary: 119 mg/dL — ABNORMAL HIGH (ref 70–99)
Glucose-Capillary: 118 mg/dL — ABNORMAL HIGH (ref 70–99)
Glucose-Capillary: 161 mg/dL — ABNORMAL HIGH (ref 70–99)

## 2021-04-23 LAB — BASIC METABOLIC PANEL
Anion gap: 10 (ref 5–15)
BUN: 25 mg/dL — ABNORMAL HIGH (ref 8–23)
CO2: 25 mmol/L (ref 22–32)
Calcium: 9.4 mg/dL (ref 8.9–10.3)
Chloride: 103 mmol/L (ref 98–111)
Creatinine, Ser: 1.34 mg/dL — ABNORMAL HIGH (ref 0.61–1.24)
GFR, Estimated: 57 mL/min — ABNORMAL LOW (ref >60)
Glucose, Bld: 123 mg/dL — ABNORMAL HIGH (ref 70–99)
Potassium: 3.8 mmol/L (ref 3.5–5.1)
Sodium: 138 mmol/L (ref 135–145)

## 2021-04-23 LAB — ECHOCARDIOGRAM COMPLETE
Height: 72 in
S' Lateral: 3.9 cm
Weight: 3702.4 oz

## 2021-04-23 LAB — HIV ANTIBODY (ROUTINE TESTING W REFLEX): HIV Screen 4th Generation wRfx: NONREACTIVE

## 2021-04-23 LAB — T4, FREE: Free T4: 1.04 ng/dL (ref 0.61–1.12)

## 2021-04-23 LAB — TSH: TSH: 5.78 u[IU]/mL — ABNORMAL HIGH (ref 0.350–4.500)

## 2021-04-23 LAB — SURGICAL PCR SCREEN
MRSA, PCR: NEGATIVE
Staphylococcus aureus: NEGATIVE

## 2021-04-23 SURGERY — PACEMAKER IMPLANT

## 2021-04-23 MED ORDER — ACETAMINOPHEN 325 MG PO TABS
325.0000 mg | ORAL_TABLET | ORAL | Status: DC | PRN
  Administered 2021-04-24: 650 mg via ORAL
  Filled 2021-04-23: qty 2

## 2021-04-23 MED ORDER — FENTANYL CITRATE (PF) 100 MCG/2ML IJ SOLN
INTRAMUSCULAR | Status: AC
  Filled 2021-04-23: qty 2

## 2021-04-23 MED ORDER — CEFAZOLIN SODIUM-DEXTROSE 2-4 GM/100ML-% IV SOLN
INTRAVENOUS | Status: AC
  Filled 2021-04-23: qty 100

## 2021-04-23 MED ORDER — CEFAZOLIN SODIUM-DEXTROSE 1-4 GM/50ML-% IV SOLN
1.0000 g | Freq: Four times a day (QID) | INTRAVENOUS | Status: AC
  Administered 2021-04-23 – 2021-04-24 (×3): 1 g via INTRAVENOUS
  Filled 2021-04-23 (×3): qty 50

## 2021-04-23 MED ORDER — HEPARIN (PORCINE) IN NACL 1000-0.9 UT/500ML-% IV SOLN
INTRAVENOUS | Status: DC | PRN
  Administered 2021-04-23: 500 mL

## 2021-04-23 MED ORDER — LIDOCAINE HCL (PF) 1 % IJ SOLN
INTRAMUSCULAR | Status: DC | PRN
  Administered 2021-04-23: 60 mL

## 2021-04-23 MED ORDER — MIDAZOLAM HCL 5 MG/5ML IJ SOLN
INTRAMUSCULAR | Status: AC
  Filled 2021-04-23: qty 5

## 2021-04-23 MED ORDER — LOSARTAN POTASSIUM 25 MG PO TABS
25.0000 mg | ORAL_TABLET | Freq: Every day | ORAL | Status: DC
  Administered 2021-04-23 – 2021-04-24 (×2): 25 mg via ORAL
  Filled 2021-04-23 (×2): qty 1

## 2021-04-23 MED ORDER — LIDOCAINE HCL (PF) 1 % IJ SOLN
INTRAMUSCULAR | Status: AC
  Filled 2021-04-23: qty 60

## 2021-04-23 MED ORDER — LIDOCAINE HCL 1 % IJ SOLN
INTRAMUSCULAR | Status: AC
  Filled 2021-04-23: qty 20

## 2021-04-23 MED ORDER — SODIUM CHLORIDE 0.9 % IV SOLN
INTRAVENOUS | Status: AC
  Filled 2021-04-23: qty 2

## 2021-04-23 MED ORDER — ATORVASTATIN CALCIUM 80 MG PO TABS
80.0000 mg | ORAL_TABLET | Freq: Every day | ORAL | Status: DC
  Administered 2021-04-23 – 2021-04-24 (×2): 80 mg via ORAL
  Filled 2021-04-23 (×2): qty 1

## 2021-04-23 MED ORDER — ONDANSETRON HCL 4 MG/2ML IJ SOLN: 4.0000 mg | Freq: Four times a day (QID) | INTRAMUSCULAR | Status: DC | PRN

## 2021-04-23 SURGICAL SUPPLY — 14 items
CABLE SURGICAL S-101-97-12 (CABLE) ×2 IMPLANT
CATH RIGHTSITE C315HIS02 (CATHETERS) ×1 IMPLANT
IPG PACE AZUR XT DR MRI W1DR01 (Pacemaker) IMPLANT
LEAD CAPSURE NOVUS 5076-52CM (Lead) ×1 IMPLANT
LEAD SELECT SECURE 3830 383069 (Lead) IMPLANT
PACE AZURE XT DR MRI W1DR01 (Pacemaker) ×2 IMPLANT
PAD PRO RADIOLUCENT 2001M-C (PAD) ×2 IMPLANT
SELECT SECURE 3830 383069 (Lead) ×2 IMPLANT
SHEATH 7FR PRELUDE SNAP 13 (SHEATH) ×1 IMPLANT
SHEATH 9FR PRELUDE SNAP 13 (SHEATH) ×1 IMPLANT
SHEATH PROBE COVER 6X72 (BAG) ×1 IMPLANT
SLITTER 6232ADJ (MISCELLANEOUS) ×1 IMPLANT
TRAY PACEMAKER INSERTION (PACKS) ×2 IMPLANT
WIRE HITORQ VERSACORE ST 145CM (WIRE) ×1 IMPLANT

## 2021-04-23 NOTE — Progress Notes (Signed)
Patient took gabapentin and lexapro from home meds.

## 2021-04-23 NOTE — Progress Notes (Signed)
Echocardiogram 2D Echocardiogram has been performed.  Oneal Deputy Sehar Sedano RDCS 04/23/2021, 9:02 AM

## 2021-04-23 NOTE — Discharge Summary (Addendum)
ELECTROPHYSIOLOGY PROCEDURE DISCHARGE SUMMARY    Patient ID: Brent Mendoza,  MRN: [id number], DOB/AGE: 1951-02-06 70 y.o.  Admit date: 04/22/2021 Discharge date: 04/24/21  Primary Care Physician: Nicoletta Dress, MD  Primary Cardiologist: Dr. Marisue Ivan Electrophysiologist: new, Dr. Curt Bears  Primary Discharge Diagnosis:  CHB  Secondary Discharge Diagnosis:  NICM Chronic CHF Recovered LVEF LBBB Obesity HTN DM  Allergies  Allergen Reactions   Promethazine     Other reaction(s): Cardiovascular Arrest (ALLERGY/intolerance) CODED IN PAST DUE TO ADMINISTRATION   Adhesive [Tape]    Codeine     Other reaction(s): Other (See Comments) Vomiting and headaches     Procedures This Admission:  1.  Implantation of a MDT dual chamber PPM on 04/23/21 by Dr Curt Bears.  The patient received a  Medtronic Azure XT DR MRI SureScan, Medtronic model 5076 (RA), Medtronic model 3830 (Left bundle position) There were no immediate post procedure complications. CXR on 04/24/21 demonstrated no pneumothorax status post device implantation.   Brief HPI: Brent Mendoza is a 70 y.o. male w/PMHx including above came [facility] with progressive SOB, weakness over a few months though more acutely in the last few weeks and days.  He was found in CHB, no nodal blocking agents on board, admitted for further management  Hospital Course:  The patient at rest largely asymptomtic, BP stable.  Labs were unrevealing, EP consulted to consider PPM. TTE was done noted improved EF from prior echo.  He underwent implantation of a PPM with details as outlined above.  He was monitored on telemetry overnight which demonstrated AV pacing.  Left chest was without hematoma or ecchymosis.  The device was interrogated and found to be functioning normally.  CXR was obtained and demonstrated no pneumothorax status post device implantation.  Wound care, arm mobility, and restrictions were reviewed with the patient.  The patient  feels well, denies any CP/SOB, with no site discomfort.  He was examined by Dr. Curt Bears and considered stable for discharge to home.    Physical Exam: Vitals:   04/23/21 2024 04/24/21 0010 04/24/21 0410 04/24/21 0825  BP: (!) 163/69 (!) 156/77 (!) 142/66 (!) 144/65  Pulse: 65 62 (!) 59 64  Resp: 14 14 14 19   Temp: 98.1 F (36.7 C) 99 F (37.2 C) 97.8 F (36.6 C) 98.4 F (36.9 C)  TempSrc: Oral Oral Oral Oral  SpO2: 97% 95% 94% 96%  Weight:   103 kg   Height:        GEN- The patient is well appearing, alert and oriented x 3 today.   HEENT: normocephalic, atraumatic; sclera clear, conjunctiva pink; hearing intact; oropharynx clear; neck supple, no JVP Lungs- CTA b/l, normal work of breathing.  No wheezes, rales, rhonchi Heart- RRR, no murmurs, rubs or gallops, PMI not laterally displaced GI- soft, non-tender, non-distended Extremities- no clubbing, cyanosis, or edema MS- no significant deformity or atrophy Skin- warm and dry, no rash or lesion,  left chest without hematoma/ecchymosis Psych- euthymic mood, full affect Neuro- no gross deficits   Labs:   Lab Results  Component Value Date   WBC 6.0 04/22/2021   HGB 14.0 04/22/2021   HCT 40.9 04/22/2021   MCV 88.7 04/22/2021   PLT 157 04/22/2021    Recent Labs  Lab 04/22/21 1444 04/23/21 0306 04/24/21 0315  NA 137   < > 136  K 4.3   < > 3.8  CL 106   < > 103  CO2 22   < >  24  BUN 27*   < > 23  CREATININE 1.42*   < > 1.36*  CALCIUM 9.6   < > 9.1  PROT 7.2  --   --   BILITOT 0.6  --   --   ALKPHOS 70  --   --   ALT 27  --   --   AST 23  --   --   GLUCOSE 177*   < > 104*   < > = values in this interval not displayed.    Discharge Medications:  Allergies as of 04/24/2021       Reactions   Promethazine    Other reaction(s): Cardiovascular Arrest (ALLERGY/intolerance) CODED IN PAST DUE TO ADMINISTRATION   Adhesive [tape]    Codeine    Other reaction(s): Other (See Comments) Vomiting and headaches         Medication List     STOP taking these medications    amLODipine 2.5 MG tablet Commonly known as: NORVASC       TAKE these medications    atorvastatin 80 MG tablet Commonly known as: LIPITOR Take 80 mg by mouth daily at 6 PM.   escitalopram 20 MG tablet Commonly known as: LEXAPRO Take 20 mg by mouth daily.   esomeprazole 20 MG capsule Commonly known as: NEXIUM Take 20 mg by mouth daily.   Fish Oil 1200 MG Caps Take 1 capsule (1,200 mg total) by mouth daily.   gabapentin 400 MG capsule Commonly known as: NEURONTIN Take 1,200 mg by mouth 3 (three) times daily as needed (pain).   HYDROmorphone 4 MG tablet Commonly known as: DILAUDID Take 4 mg by mouth every 6 (six) hours as needed for severe pain.   JARDIANCE PO Take 1 tablet by mouth every morning.   Lantus SoloStar 100 UNIT/ML Solostar Pen Generic drug: insulin glargine Inject 14 Units into the skin at bedtime.   losartan 25 MG tablet Commonly known as: Cozaar Take 1 tablet (25 mg total) by mouth daily.   losartan 25 MG tablet Commonly known as: COZAAR Take 1 tablet (25 mg total) by mouth daily. Start taking on: May 25, 2021 Notes to patient: This is the mail away prescription sent to [facility], note start date   metoprolol succinate 25 MG 24 hr tablet Commonly known as: Toprol XL Take 1 tablet (25 mg total) by mouth daily.   metoprolol succinate 25 MG 24 hr tablet Commonly known as: Toprol XL Take 1 tablet (25 mg total) by mouth daily. Start taking on: May 25, 2021 Notes to patient: This is the mail away sent to [facility], note start date   rOPINIRole 2 MG tablet Commonly known as: REQUIP Take 4 mg by mouth at bedtime. Restless leg        Disposition: Home Discharge Instructions     Diet - low sodium heart healthy   Complete by: As directed    Increase activity slowly   Complete by: As directed        Follow-up Information     [facility] Follow up.    Specialty: Cardiology Why: 05/07/21 @ 12:40PM, wound check visit Contact information: [address] [place]        Constance Haw, MD Follow up.   Specialty: Cardiology Why: 08/12/21 @ 4:15PM Contact information: [address] [place] [address] [phone]  Duration of Discharge Encounter: Greater than 30 minutes including physician time.  Venetia Night, PA-C 04/24/2021 9:27 AM  I have seen and examined this patient with Tommye Standard.  Agree with above, note added to reflect my findings.  On exam, RRR, no murmurs.  She is now status post Medtronic pacemaker for complete heart block.  Device functioning appropriately.  Chest x-ray and interrogation without issue.  Plan for discharge today with follow-up in device clinic.  Javaughn Opdahl M. [place] Beck MD 04/24/2021 12:07 PM

## 2021-04-23 NOTE — Discharge Instructions (Signed)
    Supplemental Discharge Instructions for  Pacemaker/Defibrillator Patients   Activity No heavy lifting or vigorous activity with your left/right arm for 6 to 8 weeks.  Do not raise your left/right arm above your head for one week.  Gradually raise your affected arm as drawn below.             04/28/21                    04/29/21                   04/30/21                 05/01/21 __  NO DRIVING until cleared to at your wound check visit.  WOUND CARE Keep the wound area clean and dry.  Do not get this area wet , no showers until cleared to at your wound check visit The tape/steri-strips on your wound will fall off; do not pull them off.  No bandage is needed on the site.  DO  NOT apply any creams, oils, or ointments to the wound area. If you notice any drainage or discharge from the wound, any swelling or bruising at the site, or you develop a fever > 101? F after you are discharged home, call the office at once.  Special Instructions You are still able to use cellular telephones; use the ear opposite the side where you have your pacemaker/defibrillator.  Avoid carrying your cellular phone near your device. When traveling through airports, show security personnel your identification card to avoid being screened in the metal detectors.  Ask the security personnel to use the hand wand. Avoid arc welding equipment, MRI testing (magnetic resonance imaging), TENS units (transcutaneous nerve stimulators).  Call the office for questions about other devices. Avoid electrical appliances that are in poor condition or are not properly grounded. Microwave ovens are safe to be near or to operate.

## 2021-04-23 NOTE — Progress Notes (Signed)
Mobility Specialist Progress Note:   04/23/21 1047  Mobility  Activity Ambulated in room  Level of Assistance Standby assist, set-up cues, supervision of patient - no hands on  Assistive Device None  Distance Ambulated (ft) 40 ft  Mobility Ambulated with assistance in room  Mobility Response Tolerated well  Mobility performed by Mobility specialist  $Mobility charge 1 Mobility   Pt received in bed willing to participate in mobility. Limited to room mobility d/t low HR. After second lap in room pt stated he started getting SOB. Pt left EOB with call bell in reach and all needs met.   [facility] Health and safety inspector Phone [phone]

## 2021-04-23 NOTE — Progress Notes (Addendum)
Progress Note  Patient Name: Brent Mendoza Date of Encounter: 04/23/2021  [facility] Cardiologist: Evalina Field, MD   Subjective   Feels OK at rest, no CP, not actively SOB  Inpatient Medications    Scheduled Meds:  atorvastatin  80 mg Oral Daily   chlorhexidine  60 mL Topical Once   gentamicin irrigation  80 mg Irrigation On Call   insulin aspart  0-15 Units Subcutaneous TID WC   insulin glargine  10 Units Subcutaneous QHS   losartan  25 mg Oral Daily   mupirocin ointment  1 application Nasal BID   sodium chloride flush  3 mL Intravenous Q12H   Continuous Infusions:  sodium chloride     sodium chloride     sodium chloride      ceFAZolin (ANCEF) IV     PRN Meds: sodium chloride, acetaminophen, ALPRAZolam, atropine, HYDROmorphone, nitroGLYCERIN, ondansetron (ZOFRAN) IV, sodium chloride flush, zolpidem   Vital Signs    Vitals:   04/22/21 2350 04/23/21 0404 04/23/21 0759 04/23/21 0857  BP: (!) 156/58 (!) 141/49 (!) 127/46 (!) 139/51  Pulse: (!) 30 (!) 32 (!) 30 (!) 33  Resp: 14 16  17   Temp: 98.1 F (36.7 C) 98.1 F (36.7 C) 98.1 F (36.7 C) 98.4 F (36.9 C)  TempSrc: Oral Oral Oral Oral  SpO2: 99% 97% 96% 96%  Weight:      Height:        Intake/Output Summary (Last 24 hours) at 04/23/2021 0940 Last data filed at 04/23/2021 0834 Gross per 24 hour  Intake 598 ml  Output 1120 ml  Net -522 ml   Last 3 Weights 04/22/2021 04/22/2021 04/06/2020  Weight (lbs) 231 lb 6.4 oz 230 lb 236 lb 5.3 oz  Weight (kg) 104.962 kg 104.327 kg 107.2 kg      Telemetry    CHB 30's - Personally Reviewed  ECG     CHB 31bpm, RBBB escape - Personally Reviewed  Physical Exam   GEN: No acute distress.   Neck: No JVD Cardiac: RRR, bradycardic, no murmurs, rubs, or gallops.  Respiratory: CTA b/l. GI: Soft, nontender, non-distended  MS: trace edema; No deformity. Neuro:  Nonfocal  Psych: Normal affect   Labs    High Sensitivity Troponin:   Recent Labs  Lab  04/22/21 1444 04/22/21 1728  TROPONINIHS 16 15     Chemistry Recent Labs  Lab 04/22/21 1444 04/23/21 0306  NA 137 138  K 4.3 3.8  CL 106 103  CO2 22 25  GLUCOSE 177* 123*  BUN 27* 25*  CREATININE 1.42* 1.34*  CALCIUM 9.6 9.4  PROT 7.2  --   ALBUMIN 4.1  --   AST 23  --   ALT 27  --   ALKPHOS 70  --   BILITOT 0.6  --   GFRNONAA 53* 57*  ANIONGAP 9 10    Lipids No results for input(s): CHOL, TRIG, HDL, LABVLDL, LDLCALC, CHOLHDL in the last 168 hours.  Hematology Recent Labs  Lab 04/22/21 1444  WBC 6.0  RBC 4.61  HGB 14.0  HCT 40.9  MCV 88.7  MCH 30.4  MCHC 34.2  RDW 14.6  PLT 157   Thyroid  Recent Labs  Lab 04/23/21 0306 04/23/21 0648  TSH 5.780*  --   FREET4  --  1.04    BNP Recent Labs  Lab 04/22/21 1445  BNP 753.9*    DDimer No results for input(s): DDIMER in the last 168 hours.  Radiology    TTE 07/22/2019  1. Left ventricular ejection fraction, by visual estimation, is 45-50%.  The left ventricle has mildly decreased global systolic function. There is  mildly increased left ventricular hypertrophy of the basal septum.   2. The left ventricle demonstrates global hypokinesis.   3. Left ventricular diastolic parameters are consistent with Grade I  diastolic dysfunction (impaired relaxation).   4. Global right ventricle has normal systolic function.The right  ventricular size is normal. No increase in right ventricular wall  thickness.   5. Left atrial size was normal.   6. Right atrial size was normal.   7. The mitral valve is normal in structure. Trivial mitral valve  regurgitation. No evidence of mitral stenosis   8. The tricuspid valve is normal in structure. Tricuspid valve  regurgitation is not demonstrated.   9. The aortic valve is tricuspid. Aortic valve regurgitation is not  visualized. Mild to moderate aortic valve sclerosis/calcification without  any evidence of aortic stenosis.  10. The pulmonic valve was normal in structure.  Pulmonic valve  regurgitation is trivial.  11. The inferior vena cava is normal in size with greater than 50%  respiratory variability, suggesting right atrial pressure of 3 mmHg.    NM Stress 08/10/2019 Nuclear stress EF: 42%. The left ventricular ejection fraction is moderately decreased (30-44%). There was no ST segment deviation noted during stress. This is an intermediate risk study. The study is normal.  Cardiac Studies   TTE 07/22/2019  1. Left ventricular ejection fraction, by visual estimation, is 45-50%.  The left ventricle has mildly decreased global systolic function. There is  mildly increased left ventricular hypertrophy of the basal septum.   2. The left ventricle demonstrates global hypokinesis.   3. Left ventricular diastolic parameters are consistent with Grade I  diastolic dysfunction (impaired relaxation).   4. Global right ventricle has normal systolic function.The right  ventricular size is normal. No increase in right ventricular wall  thickness.   5. Left atrial size was normal.   6. Right atrial size was normal.   7. The mitral valve is normal in structure. Trivial mitral valve  regurgitation. No evidence of mitral stenosis   8. The tricuspid valve is normal in structure. Tricuspid valve  regurgitation is not demonstrated.   9. The aortic valve is tricuspid. Aortic valve regurgitation is not  visualized. Mild to moderate aortic valve sclerosis/calcification without  any evidence of aortic stenosis.  10. The pulmonic valve was normal in structure. Pulmonic valve  regurgitation is trivial.  11. The inferior vena cava is normal in size with greater than 50%  respiratory variability, suggesting right atrial pressure of 3 mmHg.    NM Stress 08/10/2019 Nuclear stress EF: 42%. The left ventricular ejection fraction is moderately decreased (30-44%). There was no ST segment deviation noted during stress. This is an intermediate risk study. The study is  normal.   Patient Profile     70 y.o. male w/Hx of HTN, DM, obesity, LBBB, NICM (poor outpt follow up), last month had a syncopal event found to have COVID and some lower HR/BPs and his home coreg was stopped, he was admitted yesterday with progressive weakness, SOB more acutely in the last few weeks and then the last few days found in CHB  Assessment & Plan    CHB Labs unrevealing No nodal blocking agents Baseline conduction system disease with LBBB He needs PPM, Dr. Rayann Mendoza saw him yesterday discussed rational for pacing, procedure risks/benefits the patient agreeable  Brent Mendoza likely plan for CRT-P given he Brent Mendoza be pacing all of the time    NICM Chronic CHF He does not appear clinically volume OL Echo is done Brent Mendoza reviewed with LVEF improved, likely 45-50%, official read pending Plan to resume BB post pacing Losartan added this admission  HTN Looks OK, tolerating CHB  6.   Abnormal TSH Minimally elevated only, does not explain his CHB normal free T4 Follow up with PMD  For questions or updates, please contact Brent Mendoza Please consult [email or web address] for contact info under        Signed, Brent [place], PA-C  04/23/2021, 9:40 AM    I have seen and examined this patient with Brent Mendoza.  Agree with above, note added to reflect my findings.  On exam, bradycardic, no murmurs. Remains in complete AV block. If time permits Brent Mendoza plan for PPM today.   Brent Mendoza has presented today for surgery, with the diagnosis of complete AV block.  The various methods of treatment have been discussed with the patient and family. After consideration of risks, benefits and other options for treatment, the patient has consented to  Procedure(s): Pacemaker implant as a surgical intervention .  Risks include but not limited to bleeding, infection, pneumothorax, perforation, tamponade, vascular damage, renal failure, MI, stroke, death, and lead dislodgement . The patient's history  has been reviewed, patient examined, no change in status, stable for surgery.  I have reviewed the patient's chart and labs.  Questions were answered to the patient's satisfaction.     Brent Mendoza M. Arsenio Schnorr MD 04/23/2021 11:18 AM

## 2021-04-24 ENCOUNTER — Encounter (HOSPITAL_COMMUNITY): Payer: Self-pay | Admitting: Cardiology

## 2021-04-24 ENCOUNTER — Other Ambulatory Visit (HOSPITAL_COMMUNITY): Payer: Self-pay

## 2021-04-24 ENCOUNTER — Inpatient Hospital Stay (HOSPITAL_COMMUNITY): Payer: Medicare HMO

## 2021-04-24 LAB — BASIC METABOLIC PANEL
Anion gap: 9 (ref 5–15)
BUN: 23 mg/dL (ref 8–23)
CO2: 24 mmol/L (ref 22–32)
Calcium: 9.1 mg/dL (ref 8.9–10.3)
Chloride: 103 mmol/L (ref 98–111)
Creatinine, Ser: 1.36 mg/dL — ABNORMAL HIGH (ref 0.61–1.24)
GFR, Estimated: 56 mL/min — ABNORMAL LOW (ref >60)
Glucose, Bld: 104 mg/dL — ABNORMAL HIGH (ref 70–99)
Potassium: 3.8 mmol/L (ref 3.5–5.1)
Sodium: 136 mmol/L (ref 135–145)

## 2021-04-24 LAB — T3: T3, Total: 97 ng/dL (ref 71–180)

## 2021-04-24 LAB — GLUCOSE, CAPILLARY: Glucose-Capillary: 110 mg/dL — ABNORMAL HIGH (ref 70–99)

## 2021-04-24 MED ORDER — LOSARTAN POTASSIUM 25 MG PO TABS: 25.0000 mg | ORAL_TABLET | Freq: Every day | ORAL | 2 refills | Status: AC

## 2021-04-24 MED ORDER — LOSARTAN POTASSIUM 25 MG PO TABS
25.0000 mg | ORAL_TABLET | Freq: Every day | ORAL | 0 refills | Status: DC
  Filled 2021-04-24: qty 30, 30d supply, fill #0

## 2021-04-24 MED ORDER — METOPROLOL SUCCINATE ER 25 MG PO TB24: 25.0000 mg | ORAL_TABLET | Freq: Every day | ORAL | 2 refills | Status: DC

## 2021-04-24 MED ORDER — METOPROLOL SUCCINATE ER 25 MG PO TB24
25.0000 mg | ORAL_TABLET | Freq: Every day | ORAL | 0 refills | Status: DC
  Filled 2021-04-24: qty 30, 30d supply, fill #0

## 2021-04-24 MED FILL — Lidocaine HCl Local Inj 1%: INTRAMUSCULAR | Qty: 20 | Status: AC

## 2021-04-24 NOTE — Plan of Care (Signed)
  Problem: Clinical Measurements: Goal: Will remain free from infection Outcome: Progressing Goal: Diagnostic test results will improve Outcome: Progressing Goal: Cardiovascular complication will be avoided Outcome: Progressing   

## 2021-04-24 NOTE — Plan of Care (Signed)

## 2021-04-30 DIAGNOSIS — I442 Atrioventricular block, complete: Secondary | ICD-10-CM | POA: Diagnosis not present

## 2021-04-30 DIAGNOSIS — I5022 Chronic systolic (congestive) heart failure: Secondary | ICD-10-CM | POA: Diagnosis not present

## 2021-04-30 DIAGNOSIS — I10 Essential (primary) hypertension: Secondary | ICD-10-CM | POA: Diagnosis not present

## 2021-05-07 ENCOUNTER — Ambulatory Visit (INDEPENDENT_AMBULATORY_CARE_PROVIDER_SITE_OTHER): Payer: Medicare HMO

## 2021-05-07 ENCOUNTER — Other Ambulatory Visit: Payer: Self-pay

## 2021-05-07 DIAGNOSIS — I442 Atrioventricular block, complete: Secondary | ICD-10-CM | POA: Diagnosis not present

## 2021-05-07 LAB — CUP PACEART INCLINIC DEVICE CHECK
Battery Remaining Longevity: 127 mo
Battery Voltage: 3.21 V
Brady Statistic AP VP Percent: 83.04 %
Brady Statistic AP VS Percent: 0.01 %
Brady Statistic AS VP Percent: 16.81 %
Brady Statistic AS VS Percent: 0.14 %
Brady Statistic RA Percent Paced: 83.09 %
Brady Statistic RV Percent Paced: 99.85 %
Date Time Interrogation Session: 20221123140746
Implantable Lead Implant Date: 20221109
Implantable Lead Implant Date: 20221109
Implantable Lead Location: 753859
Implantable Lead Location: 753860
Implantable Lead Model: 3830
Implantable Lead Model: 5076
Implantable Pulse Generator Implant Date: 20221109
Lead Channel Impedance Value: 399 Ohm
Lead Channel Impedance Value: 418 Ohm
Lead Channel Impedance Value: 494 Ohm
Lead Channel Impedance Value: 646 Ohm
Lead Channel Pacing Threshold Amplitude: 0.75 V
Lead Channel Pacing Threshold Amplitude: 0.75 V
Lead Channel Pacing Threshold Pulse Width: 0.4 ms
Lead Channel Pacing Threshold Pulse Width: 0.4 ms
Lead Channel Sensing Intrinsic Amplitude: 4.5 mV
Lead Channel Sensing Intrinsic Amplitude: 4.75 mV
Lead Channel Sensing Intrinsic Amplitude: 5.375 mV
Lead Channel Setting Pacing Amplitude: 3.5 V
Lead Channel Setting Pacing Amplitude: 3.5 V
Lead Channel Setting Pacing Pulse Width: 0.4 ms
Lead Channel Setting Sensing Sensitivity: 0.9 mV

## 2021-05-07 NOTE — Patient Instructions (Signed)

## 2021-05-07 NOTE — Progress Notes (Signed)
Wound check appointment. Steri-strips removed. Wound without redness or edema. Incision edges approximated, wound well healed. Normal device function. Thresholds, sensing, and impedances consistent with implant measurements. Device programmed at 3.5V/auto capture programmed on for extra safety margin until 3 month visit. Histogram distribution appropriate for patient and level of activity. No mode switches or high ventricular rates noted. Patient educated about wound care, arm mobility, lifting restrictions. ROV 08/12/21 with WC

## 2021-05-23 DIAGNOSIS — I10 Essential (primary) hypertension: Secondary | ICD-10-CM | POA: Diagnosis not present

## 2021-05-23 DIAGNOSIS — E1142 Type 2 diabetes mellitus with diabetic polyneuropathy: Secondary | ICD-10-CM | POA: Diagnosis not present

## 2021-05-23 DIAGNOSIS — E785 Hyperlipidemia, unspecified: Secondary | ICD-10-CM | POA: Diagnosis not present

## 2021-05-23 DIAGNOSIS — Z23 Encounter for immunization: Secondary | ICD-10-CM | POA: Diagnosis not present

## 2021-05-23 DIAGNOSIS — Z79891 Long term (current) use of opiate analgesic: Secondary | ICD-10-CM | POA: Diagnosis not present

## 2021-05-23 DIAGNOSIS — D510 Vitamin B12 deficiency anemia due to intrinsic factor deficiency: Secondary | ICD-10-CM | POA: Diagnosis not present

## 2021-05-23 DIAGNOSIS — G894 Chronic pain syndrome: Secondary | ICD-10-CM | POA: Diagnosis not present

## 2021-07-24 ENCOUNTER — Ambulatory Visit (INDEPENDENT_AMBULATORY_CARE_PROVIDER_SITE_OTHER): Payer: Medicare HMO

## 2021-07-24 DIAGNOSIS — I442 Atrioventricular block, complete: Secondary | ICD-10-CM | POA: Diagnosis not present

## 2021-07-24 LAB — CUP PACEART REMOTE DEVICE CHECK
Battery Remaining Longevity: 146 mo
Battery Voltage: 3.17 V
Brady Statistic AP VP Percent: 89.04 %
Brady Statistic AP VS Percent: 0.05 %
Brady Statistic AS VP Percent: 9.68 %
Brady Statistic AS VS Percent: 1.23 %
Brady Statistic RA Percent Paced: 89.89 %
Brady Statistic RV Percent Paced: 98.72 %
Date Time Interrogation Session: 20230209055725
Implantable Lead Implant Date: 20221109
Implantable Lead Implant Date: 20221109
Implantable Lead Location: 753859
Implantable Lead Location: 753860
Implantable Lead Model: 3830
Implantable Lead Model: 5076
Implantable Pulse Generator Implant Date: 20221109
Lead Channel Impedance Value: 361 Ohm
Lead Channel Impedance Value: 380 Ohm
Lead Channel Impedance Value: 494 Ohm
Lead Channel Impedance Value: 646 Ohm
Lead Channel Pacing Threshold Amplitude: 0.5 V
Lead Channel Pacing Threshold Amplitude: 0.875 V
Lead Channel Pacing Threshold Pulse Width: 0.4 ms
Lead Channel Pacing Threshold Pulse Width: 0.4 ms
Lead Channel Sensing Intrinsic Amplitude: 3.625 mV
Lead Channel Sensing Intrinsic Amplitude: 3.625 mV
Lead Channel Sensing Intrinsic Amplitude: 5.375 mV
Lead Channel Setting Pacing Amplitude: 1.5 V
Lead Channel Setting Pacing Amplitude: 2 V
Lead Channel Setting Pacing Pulse Width: 0.4 ms
Lead Channel Setting Sensing Sensitivity: 0.9 mV

## 2021-07-29 NOTE — Progress Notes (Signed)
Remote pacemaker transmission.   

## 2021-08-12 ENCOUNTER — Other Ambulatory Visit: Payer: Self-pay

## 2021-08-12 ENCOUNTER — Ambulatory Visit: Payer: Medicare HMO | Admitting: Cardiology

## 2021-08-12 ENCOUNTER — Encounter: Payer: Self-pay | Admitting: Cardiology

## 2021-08-12 DIAGNOSIS — I493 Ventricular premature depolarization: Secondary | ICD-10-CM

## 2021-08-12 DIAGNOSIS — I442 Atrioventricular block, complete: Secondary | ICD-10-CM

## 2021-08-12 NOTE — Progress Notes (Signed)
Electrophysiology Office Note   Date:  08/12/2021   ID:  Brent Mendoza, DOB May 04, 1951, MRN [id number]  PCP:  Nicoletta Dress, MD  Cardiologist:  Farris Has Primary Electrophysiologist:  Elide Stalzer Meredith Leeds, MD    Chief Complaint: pacemaker   History of Present Illness: Brent Mendoza is a 71 y.o. male who is being seen today for the evaluation of pacemaker at the request of Nicoletta Dress, MD. Presenting today for electrophysiology evaluation.  He has a history significant for nonischemic cardiomyopathy with recovered ejection fraction, obesity, hypertension, diabetes.  He presented to the hospital November 2022 and complete heart block.  He is now status post Medtronic dual-chamber pacemaker implanted 04/23/2021.  Today, he denies symptoms of palpitations, chest pain, shortness of breath, orthopnea, PND, lower extremity edema, claudication, dizziness, presyncope, syncope, bleeding, or neurologic sequela. The patient is tolerating medications without difficulties.  Since pacemaker was implanted he has done well.  He has had no chest pain or shortness of breath.  He is able to all of his daily activities.   Past Medical History:  Diagnosis Date   CHF (congestive heart failure) ([place])    Diabetes mellitus without complication ([place])    Hyperlipidemia    Hypertension    Past Surgical History:  Procedure Laterality Date   ABDOMINAL SURGERY     LEG SURGERY     PACEMAKER IMPLANT N/A 04/23/2021   Procedure: PACEMAKER IMPLANT;  Surgeon: Constance Haw, MD;  Location: [place] [facility];  Service: Cardiovascular;  Laterality: N/A;     Current Outpatient Medications  Medication Sig Dispense Refill   amLODipine (NORVASC) 2.5 MG tablet Take 2.5 mg by mouth daily.     atorvastatin (LIPITOR) 80 MG tablet Take 80 mg by mouth daily at 6 PM.      escitalopram (LEXAPRO) 20 MG tablet Take 20 mg by mouth daily.      esomeprazole (NEXIUM) 20 MG capsule Take 20 mg by mouth daily.       gabapentin (NEURONTIN) 400 MG capsule Take 1,200 mg by mouth 3 (three) times daily as needed (pain).      HYDROmorphone (DILAUDID) 4 MG tablet Take 4 mg by mouth every 6 (six) hours as needed for severe pain.      insulin glargine (LANTUS SOLOSTAR) 100 UNIT/ML Solostar Pen Inject 14 Units into the skin at bedtime. 15 mL 11   losartan (COZAAR) 25 MG tablet Take 1 tablet (25 mg total) by mouth daily. 90 tablet 2   metoprolol succinate (TOPROL XL) 25 MG 24 hr tablet Take 1 tablet (25 mg total) by mouth daily. 90 tablet 2   rOPINIRole (REQUIP) 2 MG tablet Take 4 mg by mouth at bedtime. Restless leg     losartan (COZAAR) 25 MG tablet Take 1 tablet (25 mg total) by mouth daily. 30 tablet 0   metoprolol succinate (TOPROL XL) 25 MG 24 hr tablet Take 1 tablet (25 mg total) by mouth daily. 30 tablet 0   No current facility-administered medications for this visit.    Allergies:   Promethazine, Adhesive [tape], and Codeine   Social History:  The patient  reports that he has quit smoking. His smoking use included cigarettes. He has never used smokeless tobacco. He reports that he does not drink alcohol and does not use drugs.   Family History:  The patient's family history includes Heart disease in his father and mother; Stroke in his brother.    ROS:  Please see the history  of present illness.   Otherwise, review of systems is positive for none.   All other systems are reviewed and negative.    PHYSICAL EXAM: VS:  BP 112/60    Pulse 64    Ht 6' (1.829 m)    Wt 241 lb 6.4 oz (109.5 kg)    SpO2 93%    BMI 32.74 kg/m  , BMI Body mass index is 32.74 kg/m. GEN: Well nourished, well developed, in no acute distress  HEENT: normal  Neck: no JVD, carotid bruits, or masses Cardiac: RRR; no murmurs, rubs, or gallops,no edema  Respiratory:  clear to auscultation bilaterally, normal work of breathing GI: soft, nontender, nondistended, + BS MS: no deformity or atrophy  Skin: warm and dry, device pocket is  well healed Neuro:  Strength and sensation are intact Psych: euthymic mood, full affect  EKG:  EKG is ordered today. Personal review of the ekg ordered shows sinus rhythm, ventricular paced  Device interrogation is reviewed today in detail.  See PaceArt for details.   Recent Labs: 04/22/2021: ALT 27; B Natriuretic Peptide 753.9; Hemoglobin 14.0; Platelets 157 04/23/2021: TSH 5.780 04/24/2021: BUN 23; Creatinine, Ser 1.36; Potassium 3.8; Sodium 136    Lipid Panel     Component Value Date/Time   LDLDIRECT 75.5 07/22/2019 0951     Wt Readings from Last 3 Encounters:  08/12/21 241 lb 6.4 oz (109.5 kg)  04/24/21 227 lb (103 kg)  04/06/20 236 lb 5.3 oz (107.2 kg)      Other studies Reviewed: Additional studies/ records that were reviewed today include: TTE 04/23/21  Review of the above records today demonstrates:   1. Left ventricular ejection fraction, by estimation, is 55%. The left  ventricle has normal function. The left ventricle has no regional wall  motion abnormalities. Left ventricular diastolic parameters are  indeterminate.   2. Right ventricular systolic function is normal. The right ventricular  size is normal.   3. Left atrial size was mildly dilated.   4. Right atrial size was mildly dilated.   5. The mitral valve is normal in structure. Mild mitral valve  regurgitation. No evidence of mitral stenosis.   6. The aortic valve is tricuspid. Aortic valve regurgitation is not  visualized. Mild aortic valve sclerosis is present, with no evidence of  aortic valve stenosis.   7. The inferior vena cava is normal in size with <50% respiratory  variability, suggesting right atrial pressure of 8 mmHg.   8. The patient appeared to be in complete heart block.   ASSESSMENT AND PLAN:  1.  Complete heart block: Status post Medtronic dual-chamber pacemaker.  Device functioning appropriately.  No changes at this time.  2.  Hypertension: Blood pressure currently well  controlled.  Current medicines are reviewed at length with the patient today.   The patient does not have concerns regarding his medicines.  The following changes were made today:  none  Labs/ tests ordered today include:  Orders Placed This Encounter  Procedures   EKG 12-Lead     Disposition:   FU with Ilah Boule 9 months  Signed, Jory Tanguma Meredith Leeds, MD  08/12/2021 4:49 PM     [place] [address] [place] [address] [phone] (office) [phone] (fax)

## 2021-08-25 DIAGNOSIS — E785 Hyperlipidemia, unspecified: Secondary | ICD-10-CM | POA: Diagnosis not present

## 2021-08-25 DIAGNOSIS — I5022 Chronic systolic (congestive) heart failure: Secondary | ICD-10-CM | POA: Diagnosis not present

## 2021-08-25 DIAGNOSIS — Z79891 Long term (current) use of opiate analgesic: Secondary | ICD-10-CM | POA: Diagnosis not present

## 2021-08-25 DIAGNOSIS — F331 Major depressive disorder, recurrent, moderate: Secondary | ICD-10-CM | POA: Diagnosis not present

## 2021-08-25 DIAGNOSIS — E1142 Type 2 diabetes mellitus with diabetic polyneuropathy: Secondary | ICD-10-CM | POA: Diagnosis not present

## 2021-08-25 DIAGNOSIS — F419 Anxiety disorder, unspecified: Secondary | ICD-10-CM | POA: Diagnosis not present

## 2021-08-25 DIAGNOSIS — Z125 Encounter for screening for malignant neoplasm of prostate: Secondary | ICD-10-CM | POA: Diagnosis not present

## 2021-08-25 DIAGNOSIS — D510 Vitamin B12 deficiency anemia due to intrinsic factor deficiency: Secondary | ICD-10-CM | POA: Diagnosis not present

## 2021-08-25 DIAGNOSIS — G894 Chronic pain syndrome: Secondary | ICD-10-CM | POA: Diagnosis not present

## 2021-08-25 DIAGNOSIS — I10 Essential (primary) hypertension: Secondary | ICD-10-CM | POA: Diagnosis not present

## 2021-09-15 IMAGING — DX DG CHEST 1V PORT
1 series · 1 of 1 positions shown · non-contrast
Comparison: 07/21/2019

CLINICAL DATA: Syncope

EXAM:
PORTABLE CHEST 1 VIEW

[chest ap]
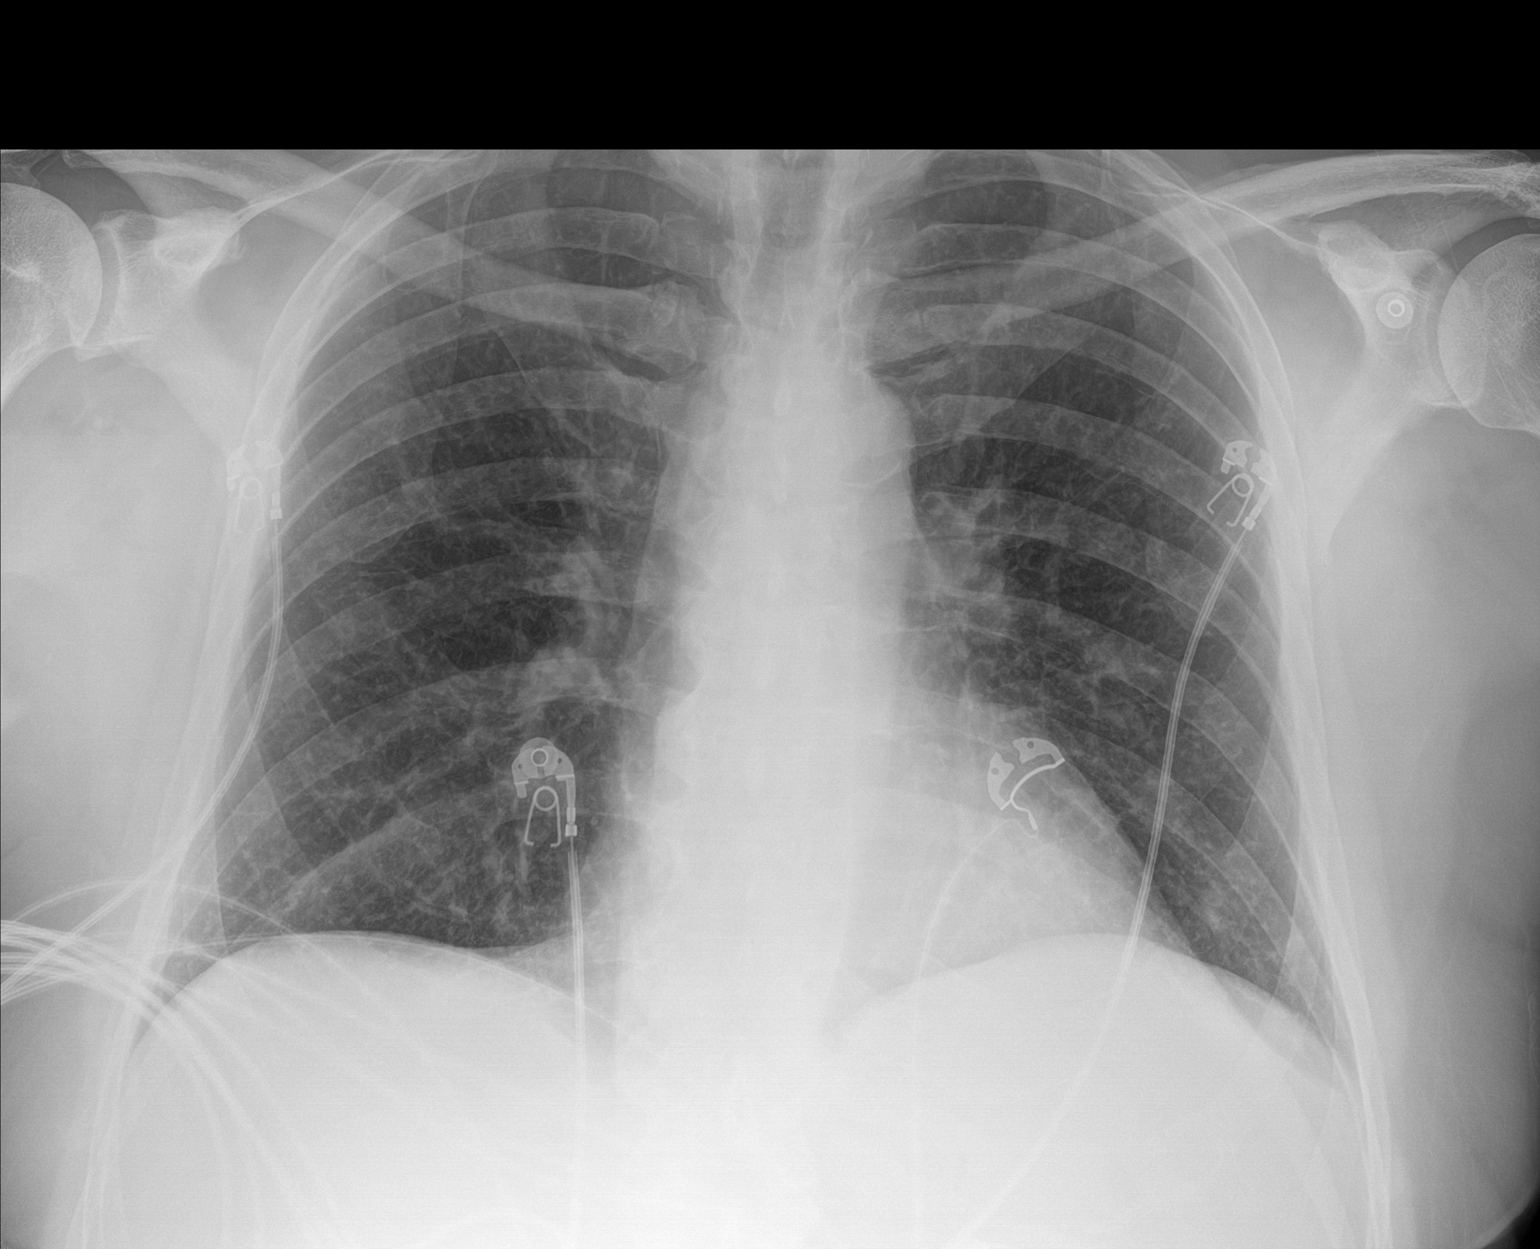

[1 of 1 positions shown; findings below may reference images not displayed]

FINDINGS: Heart size and pulmonary vascularity are normal. Suggestion of
patchy infiltrates in the left perihilar region. Possibly pneumonia
or aspiration. No pleural effusions. No pneumothorax. Mediastinal
contours appear intact. Calcification of the aorta.
IMPRESSION: Suggestion of patchy infiltrates in the left perihilar region.

## 2021-10-12 DIAGNOSIS — E1142 Type 2 diabetes mellitus with diabetic polyneuropathy: Secondary | ICD-10-CM | POA: Diagnosis not present

## 2021-10-12 DIAGNOSIS — I5022 Chronic systolic (congestive) heart failure: Secondary | ICD-10-CM | POA: Diagnosis not present

## 2021-10-12 DIAGNOSIS — J449 Chronic obstructive pulmonary disease, unspecified: Secondary | ICD-10-CM | POA: Diagnosis not present

## 2021-10-23 ENCOUNTER — Ambulatory Visit (INDEPENDENT_AMBULATORY_CARE_PROVIDER_SITE_OTHER): Payer: Medicare HMO

## 2021-10-23 DIAGNOSIS — I442 Atrioventricular block, complete: Secondary | ICD-10-CM

## 2021-10-23 LAB — CUP PACEART REMOTE DEVICE CHECK
Battery Remaining Longevity: 142 mo
Battery Voltage: 3.13 V
Brady Statistic AP VP Percent: 74.85 %
Brady Statistic AP VS Percent: 0.01 %
Brady Statistic AS VP Percent: 24.99 %
Brady Statistic AS VS Percent: 0.15 %
Brady Statistic RA Percent Paced: 74.92 %
Brady Statistic RV Percent Paced: 99.84 %
Date Time Interrogation Session: 20230510194434
Implantable Lead Implant Date: 20221109
Implantable Lead Implant Date: 20221109
Implantable Lead Location: 753859
Implantable Lead Location: 753860
Implantable Lead Model: 3830
Implantable Lead Model: 5076
Implantable Pulse Generator Implant Date: 20221109
Lead Channel Impedance Value: 380 Ohm
Lead Channel Impedance Value: 380 Ohm
Lead Channel Impedance Value: 437 Ohm
Lead Channel Impedance Value: 646 Ohm
Lead Channel Pacing Threshold Amplitude: 0.5 V
Lead Channel Pacing Threshold Amplitude: 1 V
Lead Channel Pacing Threshold Pulse Width: 0.4 ms
Lead Channel Pacing Threshold Pulse Width: 0.4 ms
Lead Channel Sensing Intrinsic Amplitude: 4.5 mV
Lead Channel Sensing Intrinsic Amplitude: 4.5 mV
Lead Channel Sensing Intrinsic Amplitude: 4.75 mV
Lead Channel Sensing Intrinsic Amplitude: 4.75 mV
Lead Channel Setting Pacing Amplitude: 1.5 V
Lead Channel Setting Pacing Amplitude: 2 V
Lead Channel Setting Pacing Pulse Width: 0.4 ms
Lead Channel Setting Sensing Sensitivity: 0.9 mV

## 2021-10-30 NOTE — Progress Notes (Signed)
Remote pacemaker transmission.   

## 2021-12-04 DIAGNOSIS — E1142 Type 2 diabetes mellitus with diabetic polyneuropathy: Secondary | ICD-10-CM | POA: Diagnosis not present

## 2021-12-04 DIAGNOSIS — I5022 Chronic systolic (congestive) heart failure: Secondary | ICD-10-CM | POA: Diagnosis not present

## 2021-12-04 DIAGNOSIS — G894 Chronic pain syndrome: Secondary | ICD-10-CM | POA: Diagnosis not present

## 2021-12-04 DIAGNOSIS — F419 Anxiety disorder, unspecified: Secondary | ICD-10-CM | POA: Diagnosis not present

## 2021-12-04 DIAGNOSIS — F331 Major depressive disorder, recurrent, moderate: Secondary | ICD-10-CM | POA: Diagnosis not present

## 2021-12-04 DIAGNOSIS — I10 Essential (primary) hypertension: Secondary | ICD-10-CM | POA: Diagnosis not present

## 2021-12-04 DIAGNOSIS — Z79891 Long term (current) use of opiate analgesic: Secondary | ICD-10-CM | POA: Diagnosis not present

## 2021-12-04 DIAGNOSIS — E785 Hyperlipidemia, unspecified: Secondary | ICD-10-CM | POA: Diagnosis not present

## 2021-12-04 DIAGNOSIS — D510 Vitamin B12 deficiency anemia due to intrinsic factor deficiency: Secondary | ICD-10-CM | POA: Diagnosis not present

## 2022-01-01 DIAGNOSIS — I110 Hypertensive heart disease with heart failure: Secondary | ICD-10-CM | POA: Diagnosis not present

## 2022-01-01 DIAGNOSIS — E7800 Pure hypercholesterolemia, unspecified: Secondary | ICD-10-CM | POA: Diagnosis not present

## 2022-01-01 DIAGNOSIS — N289 Disorder of kidney and ureter, unspecified: Secondary | ICD-10-CM | POA: Diagnosis not present

## 2022-01-01 DIAGNOSIS — Z950 Presence of cardiac pacemaker: Secondary | ICD-10-CM | POA: Diagnosis not present

## 2022-01-01 DIAGNOSIS — Z794 Long term (current) use of insulin: Secondary | ICD-10-CM | POA: Diagnosis not present

## 2022-01-01 DIAGNOSIS — A419 Sepsis, unspecified organism: Secondary | ICD-10-CM | POA: Diagnosis not present

## 2022-01-01 DIAGNOSIS — L03032 Cellulitis of left toe: Secondary | ICD-10-CM | POA: Diagnosis not present

## 2022-01-01 DIAGNOSIS — J449 Chronic obstructive pulmonary disease, unspecified: Secondary | ICD-10-CM | POA: Diagnosis not present

## 2022-01-01 DIAGNOSIS — E1142 Type 2 diabetes mellitus with diabetic polyneuropathy: Secondary | ICD-10-CM | POA: Diagnosis not present

## 2022-01-02 DIAGNOSIS — N289 Disorder of kidney and ureter, unspecified: Secondary | ICD-10-CM | POA: Diagnosis not present

## 2022-01-02 DIAGNOSIS — Z794 Long term (current) use of insulin: Secondary | ICD-10-CM | POA: Diagnosis not present

## 2022-01-02 DIAGNOSIS — E11628 Type 2 diabetes mellitus with other skin complications: Secondary | ICD-10-CM | POA: Diagnosis not present

## 2022-01-02 DIAGNOSIS — I110 Hypertensive heart disease with heart failure: Secondary | ICD-10-CM | POA: Diagnosis not present

## 2022-01-02 DIAGNOSIS — L600 Ingrowing nail: Secondary | ICD-10-CM | POA: Diagnosis not present

## 2022-01-02 DIAGNOSIS — E876 Hypokalemia: Secondary | ICD-10-CM | POA: Diagnosis not present

## 2022-01-02 DIAGNOSIS — E7800 Pure hypercholesterolemia, unspecified: Secondary | ICD-10-CM | POA: Diagnosis not present

## 2022-01-02 DIAGNOSIS — F32A Depression, unspecified: Secondary | ICD-10-CM | POA: Diagnosis not present

## 2022-01-02 DIAGNOSIS — E1142 Type 2 diabetes mellitus with diabetic polyneuropathy: Secondary | ICD-10-CM | POA: Diagnosis not present

## 2022-01-02 DIAGNOSIS — A419 Sepsis, unspecified organism: Secondary | ICD-10-CM | POA: Diagnosis not present

## 2022-01-02 DIAGNOSIS — L03032 Cellulitis of left toe: Secondary | ICD-10-CM | POA: Diagnosis not present

## 2022-01-02 DIAGNOSIS — N179 Acute kidney failure, unspecified: Secondary | ICD-10-CM | POA: Diagnosis not present

## 2022-01-02 DIAGNOSIS — C9241 Acute promyelocytic leukemia, in remission: Secondary | ICD-10-CM | POA: Diagnosis not present

## 2022-01-02 DIAGNOSIS — L03116 Cellulitis of left lower limb: Secondary | ICD-10-CM | POA: Diagnosis not present

## 2022-01-02 DIAGNOSIS — I5022 Chronic systolic (congestive) heart failure: Secondary | ICD-10-CM | POA: Diagnosis not present

## 2022-01-03 DIAGNOSIS — F32A Depression, unspecified: Secondary | ICD-10-CM | POA: Diagnosis not present

## 2022-01-03 DIAGNOSIS — E1142 Type 2 diabetes mellitus with diabetic polyneuropathy: Secondary | ICD-10-CM | POA: Diagnosis not present

## 2022-01-03 DIAGNOSIS — I110 Hypertensive heart disease with heart failure: Secondary | ICD-10-CM | POA: Diagnosis not present

## 2022-01-03 DIAGNOSIS — N179 Acute kidney failure, unspecified: Secondary | ICD-10-CM | POA: Diagnosis not present

## 2022-01-03 DIAGNOSIS — I5022 Chronic systolic (congestive) heart failure: Secondary | ICD-10-CM | POA: Diagnosis not present

## 2022-01-03 DIAGNOSIS — Z794 Long term (current) use of insulin: Secondary | ICD-10-CM | POA: Diagnosis not present

## 2022-01-03 DIAGNOSIS — E876 Hypokalemia: Secondary | ICD-10-CM | POA: Diagnosis not present

## 2022-01-03 DIAGNOSIS — L600 Ingrowing nail: Secondary | ICD-10-CM | POA: Diagnosis not present

## 2022-01-03 DIAGNOSIS — L03116 Cellulitis of left lower limb: Secondary | ICD-10-CM | POA: Diagnosis not present

## 2022-01-04 DIAGNOSIS — L600 Ingrowing nail: Secondary | ICD-10-CM | POA: Diagnosis not present

## 2022-01-04 DIAGNOSIS — F32A Depression, unspecified: Secondary | ICD-10-CM | POA: Diagnosis not present

## 2022-01-04 DIAGNOSIS — E1142 Type 2 diabetes mellitus with diabetic polyneuropathy: Secondary | ICD-10-CM | POA: Diagnosis not present

## 2022-01-04 DIAGNOSIS — I110 Hypertensive heart disease with heart failure: Secondary | ICD-10-CM | POA: Diagnosis not present

## 2022-01-04 DIAGNOSIS — A419 Sepsis, unspecified organism: Secondary | ICD-10-CM | POA: Diagnosis not present

## 2022-01-04 DIAGNOSIS — N289 Disorder of kidney and ureter, unspecified: Secondary | ICD-10-CM | POA: Diagnosis not present

## 2022-01-04 DIAGNOSIS — Z794 Long term (current) use of insulin: Secondary | ICD-10-CM | POA: Diagnosis not present

## 2022-01-04 DIAGNOSIS — L03032 Cellulitis of left toe: Secondary | ICD-10-CM | POA: Diagnosis not present

## 2022-01-04 DIAGNOSIS — I5022 Chronic systolic (congestive) heart failure: Secondary | ICD-10-CM | POA: Diagnosis not present

## 2022-01-09 DIAGNOSIS — E1142 Type 2 diabetes mellitus with diabetic polyneuropathy: Secondary | ICD-10-CM | POA: Diagnosis not present

## 2022-01-09 DIAGNOSIS — L03032 Cellulitis of left toe: Secondary | ICD-10-CM | POA: Diagnosis not present

## 2022-01-09 DIAGNOSIS — A419 Sepsis, unspecified organism: Secondary | ICD-10-CM | POA: Diagnosis not present

## 2022-01-09 DIAGNOSIS — N179 Acute kidney failure, unspecified: Secondary | ICD-10-CM | POA: Diagnosis not present

## 2022-01-12 DIAGNOSIS — J449 Chronic obstructive pulmonary disease, unspecified: Secondary | ICD-10-CM | POA: Diagnosis not present

## 2022-01-12 DIAGNOSIS — E1142 Type 2 diabetes mellitus with diabetic polyneuropathy: Secondary | ICD-10-CM | POA: Diagnosis not present

## 2022-01-12 DIAGNOSIS — I5022 Chronic systolic (congestive) heart failure: Secondary | ICD-10-CM | POA: Diagnosis not present

## 2022-01-15 ENCOUNTER — Other Ambulatory Visit: Payer: Self-pay | Admitting: Physician Assistant

## 2022-01-16 DIAGNOSIS — E875 Hyperkalemia: Secondary | ICD-10-CM | POA: Diagnosis not present

## 2022-01-22 ENCOUNTER — Ambulatory Visit (INDEPENDENT_AMBULATORY_CARE_PROVIDER_SITE_OTHER): Payer: Medicare HMO

## 2022-01-22 DIAGNOSIS — I442 Atrioventricular block, complete: Secondary | ICD-10-CM | POA: Diagnosis not present

## 2022-01-23 LAB — CUP PACEART REMOTE DEVICE CHECK
Battery Remaining Longevity: 140 mo
Battery Voltage: 3.07 V
Brady Statistic AP VP Percent: 86.31 %
Brady Statistic AP VS Percent: 0.02 %
Brady Statistic AS VP Percent: 12.94 %
Brady Statistic AS VS Percent: 0.73 %
Brady Statistic RA Percent Paced: 86.84 %
Brady Statistic RV Percent Paced: 99.25 %
Date Time Interrogation Session: 20230810063011
Implantable Lead Implant Date: 20221109
Implantable Lead Implant Date: 20221109
Implantable Lead Location: 753859
Implantable Lead Location: 753860
Implantable Lead Model: 3830
Implantable Lead Model: 5076
Implantable Pulse Generator Implant Date: 20221109
Lead Channel Impedance Value: 361 Ohm
Lead Channel Impedance Value: 380 Ohm
Lead Channel Impedance Value: 494 Ohm
Lead Channel Impedance Value: 627 Ohm
Lead Channel Pacing Threshold Amplitude: 0.75 V
Lead Channel Pacing Threshold Amplitude: 1 V
Lead Channel Pacing Threshold Pulse Width: 0.4 ms
Lead Channel Pacing Threshold Pulse Width: 0.4 ms
Lead Channel Sensing Intrinsic Amplitude: 4 mV
Lead Channel Sensing Intrinsic Amplitude: 4 mV
Lead Channel Sensing Intrinsic Amplitude: 4.75 mV
Lead Channel Sensing Intrinsic Amplitude: 4.75 mV
Lead Channel Setting Pacing Amplitude: 1.5 V
Lead Channel Setting Pacing Amplitude: 2 V
Lead Channel Setting Pacing Pulse Width: 0.4 ms
Lead Channel Setting Sensing Sensitivity: 0.9 mV

## 2022-02-03 ENCOUNTER — Ambulatory Visit (INDEPENDENT_AMBULATORY_CARE_PROVIDER_SITE_OTHER): Payer: Medicare HMO

## 2022-02-03 ENCOUNTER — Ambulatory Visit: Payer: Medicare HMO | Admitting: Podiatry

## 2022-02-03 DIAGNOSIS — L97529 Non-pressure chronic ulcer of other part of left foot with unspecified severity: Secondary | ICD-10-CM | POA: Diagnosis not present

## 2022-02-03 DIAGNOSIS — L600 Ingrowing nail: Secondary | ICD-10-CM

## 2022-02-03 MED ORDER — AMOXICILLIN-POT CLAVULANATE 875-125 MG PO TABS: 1.0000 | ORAL_TABLET | Freq: Two times a day (BID) | ORAL | 0 refills | Status: AC

## 2022-02-03 MED ORDER — DOXYCYCLINE HYCLATE 100 MG PO TABS: 100.0000 mg | ORAL_TABLET | Freq: Two times a day (BID) | ORAL | 0 refills | Status: AC

## 2022-02-03 NOTE — Patient Instructions (Signed)

## 2022-02-10 NOTE — Progress Notes (Signed)
Subjective:   Patient ID: Brent Mendoza, male   DOB: 71 y.o.   MRN: [id number]   HPI Chief Complaint  Patient presents with   Diabetic Ulcer    Diabetic ulcer left hallux, A1c-  7.9 BG- 224 (yesterday) Started 17 years ago, started as a ingrown toenail, had a injury and had surgery 20 years ago, toe is bleeding and swelling, red and had some drainage.    71 year old male presents the office with concerns noted above.  The ingrown toenail that has become thick and discolored causing discomfort.  Is been intermittent issue over several years.   Review of Systems  All other systems reviewed and are negative.  Past Medical History:  Diagnosis Date   CHF (congestive heart failure) ([place])    Diabetes mellitus without complication ([place])    Hyperlipidemia    Hypertension     Past Surgical History:  Procedure Laterality Date   ABDOMINAL SURGERY     LEG SURGERY     PACEMAKER IMPLANT N/A 04/23/2021   Procedure: PACEMAKER IMPLANT;  Surgeon: Constance Haw, MD;  Location: [facility];  Service: Cardiovascular;  Laterality: N/A;     Current Outpatient Medications:    amoxicillin-clavulanate (AUGMENTIN) 875-125 MG tablet, Take 1 tablet by mouth 2 (two) times daily., Disp: 20 tablet, Rfl: 0   doxycycline (VIBRA-TABS) 100 MG tablet, Take 1 tablet (100 mg total) by mouth 2 (two) times daily., Disp: 14 tablet, Rfl: 0   amLODipine (NORVASC) 2.5 MG tablet, Take 2.5 mg by mouth daily., Disp: , Rfl:    atorvastatin (LIPITOR) 80 MG tablet, Take 80 mg by mouth daily at 6 PM. , Disp: , Rfl:    escitalopram (LEXAPRO) 20 MG tablet, Take 20 mg by mouth daily. , Disp: , Rfl:    esomeprazole (NEXIUM) 20 MG capsule, Take 20 mg by mouth daily. , Disp: , Rfl:    gabapentin (NEURONTIN) 400 MG capsule, Take 1,200 mg by mouth 3 (three) times daily as needed (pain). , Disp: , Rfl:    HYDROmorphone (DILAUDID) 4 MG tablet, Take 4 mg by mouth every 6 (six) hours as needed for severe pain. , Disp: , Rfl:     insulin glargine (LANTUS SOLOSTAR) 100 UNIT/ML Solostar Pen, Inject 14 Units into the skin at bedtime., Disp: 15 mL, Rfl: 11   losartan (COZAAR) 25 MG tablet, Take 1 tablet (25 mg total) by mouth daily., Disp: 90 tablet, Rfl: 2   metoprolol succinate (TOPROL-XL) 25 MG 24 hr tablet, TAKE 1 TABLET EVERY DAY, Disp: 90 tablet, Rfl: 1   rOPINIRole (REQUIP) 2 MG tablet, Take 4 mg by mouth at bedtime. Restless leg, Disp: , Rfl:   Allergies  Allergen Reactions   Promethazine     Other reaction(s): Cardiovascular Arrest (ALLERGY/intolerance) CODED IN PAST DUE TO ADMINISTRATION   Adhesive [Tape]    Codeine     Other reaction(s): Other (See Comments) Vomiting and headaches          Objective:  Physical Exam  General: AAO x3, NAD  Dermatological: Right hallux nail is hypertrophic, dystrophic with yellow, brown discoloration but there is localized edema and erythema to the nail border there is no purulence today and there is no ascending cellulitis.  Incurvation of the nail borders.  No open lesions.  Vascular: Dorsalis Pedis artery and Posterior Tibial artery pedal pulses are 2/4 bilateral with immedate capillary fill time. There is no pain with calf compression, swelling, warmth, erythema.   Neruologic: Grossly intact  via light touch bilateral.   Musculoskeletal: Tenderness palpation the right hallux toenail.  Muscular strength 5/5 in all groups tested bilateral.  Gait: Unassisted, Nonantalgic.       Assessment:   71 year old male right hallux toenail infection,      Plan:  -Treatment options discussed including all alternatives, risks, and complications -Etiology of symptoms were discussed -X-rays obtained reviewed.  3 views of the left foot were obtained.  Arthritic changes present the first MPJ, talonavicular joint,  ankle.  No evidence of acute fracture.  Small cyst noted within the body of the calcaneus. -At this time, recommended total nail removal without chemical  matricectomy to the right hallux nail given pain, infection.  Risks and complications were discussed with the patient for which they understand and  verbally consent to the procedure. Under sterile conditions a total of 3 mL of a mixture of 2% lidocaine plain and 0.5% Marcaine plain was infiltrated in a hallux block fashion. Once anesthetized, the skin was prepped in sterile fashion. A tourniquet was then applied. Next the right hallux nail was removed in total making sure remove all nail borders.  Once the nail was removed, the area was debrided and the underlying skin was intact. The area was irrigated and hemostasis was obtained.  A dry sterile dressing was applied. After application of the dressing the tourniquet was removed and there is found to be an immediate capillary refill time to the digit. The patient tolerated the procedure well any complications. Post procedure instructions were discussed the patient for which he verbally understood. Discussed signs/symptoms of worsening infection and directed to call the office immediately should any occur or go directly to the emergency room. In the meantime, encouraged to call the office with any questions, concerns, changes symptoms. -Augmentin  Trula Slade DPM

## 2022-02-19 NOTE — Progress Notes (Signed)
Remote pacemaker transmission.   

## 2022-02-24 ENCOUNTER — Ambulatory Visit: Payer: Medicare HMO | Admitting: Podiatry

## 2022-02-24 DIAGNOSIS — L600 Ingrowing nail: Secondary | ICD-10-CM | POA: Diagnosis not present

## 2022-02-24 DIAGNOSIS — L97529 Non-pressure chronic ulcer of other part of left foot with unspecified severity: Secondary | ICD-10-CM | POA: Diagnosis not present

## 2022-02-24 MED ORDER — CEPHALEXIN 500 MG PO CAPS: 500.0000 mg | ORAL_CAPSULE | Freq: Three times a day (TID) | ORAL | 0 refills | Status: AC

## 2022-02-24 NOTE — Patient Instructions (Signed)

## 2022-03-02 NOTE — Progress Notes (Signed)
Subjective: 71 year old male presents the office today for proper evaluation undergoing nail avulsion right hallux.  States been doing well but gets some bleeding.  No pus.  No pain.  He also has a wound on his left big toe.  He also states he will get ingrown portions of the nail and they get out himself.  Objective: AAO x3, NAD DP/PT pulses palpable bilaterally, CRT less than 3 seconds Status post hallux nail avulsion.  Granulation tissue is present there is some macerated tissue present.  There is no exposed bone or tendon.  No cellulitis.  No fluctuation crepitation but there is no mild odor.   The left hallux small piece of nail still present on medial nail border was removed without complications, no bleeding occurred.  There is a granular wound present on the plantar aspect the hallux measuring 0.6 x 0.5 x 0.2 cm without any probing to bone or tunneling.  No surrounding erythema, ascending cellulitis.  No fluctuation or crepitation.  No malodor. No pain with calf compression, swelling, warmth, erythema  Assessment: Status post right nail avulsion, ulceration left hallux  Plan: -All treatment options discussed with the patient including all alternatives, risks, complications.  -Right side healing well.  Discussed continue washing with soap and water, antibiotic ointment and dressing changes daily.  Recommended small amount of antibiotic ointment.  I applied Betadine today.  Can also use Betadine to help with the macerated tissue. -Noted piece of nail on the left hallux medial nail border without any issues.  Some bleeding occurred.  There is already granular wound with that he has tried to remove the nail himself.  There is an ulceration present plantar aspect.  Recommended Betadine dressing changes daily.  Offloading. -Keflex -Monitor for any clinical signs or symptoms of infection and directed to call the office immediately should any occur or go to the ER. -Patient encouraged to call the  office with any questions, concerns, change in symptoms.   Trula Slade DPM

## 2022-03-10 DIAGNOSIS — H524 Presbyopia: Secondary | ICD-10-CM | POA: Diagnosis not present

## 2022-03-10 DIAGNOSIS — E119 Type 2 diabetes mellitus without complications: Secondary | ICD-10-CM | POA: Diagnosis not present

## 2022-03-18 DIAGNOSIS — F331 Major depressive disorder, recurrent, moderate: Secondary | ICD-10-CM | POA: Diagnosis not present

## 2022-03-18 DIAGNOSIS — Z9181 History of falling: Secondary | ICD-10-CM | POA: Diagnosis not present

## 2022-03-18 DIAGNOSIS — I10 Essential (primary) hypertension: Secondary | ICD-10-CM | POA: Diagnosis not present

## 2022-03-18 DIAGNOSIS — G894 Chronic pain syndrome: Secondary | ICD-10-CM | POA: Diagnosis not present

## 2022-03-18 DIAGNOSIS — E785 Hyperlipidemia, unspecified: Secondary | ICD-10-CM | POA: Diagnosis not present

## 2022-03-18 DIAGNOSIS — Z79891 Long term (current) use of opiate analgesic: Secondary | ICD-10-CM | POA: Diagnosis not present

## 2022-03-18 DIAGNOSIS — D510 Vitamin B12 deficiency anemia due to intrinsic factor deficiency: Secondary | ICD-10-CM | POA: Diagnosis not present

## 2022-03-18 DIAGNOSIS — F419 Anxiety disorder, unspecified: Secondary | ICD-10-CM | POA: Diagnosis not present

## 2022-03-18 DIAGNOSIS — E1142 Type 2 diabetes mellitus with diabetic polyneuropathy: Secondary | ICD-10-CM | POA: Diagnosis not present

## 2022-03-18 DIAGNOSIS — I5022 Chronic systolic (congestive) heart failure: Secondary | ICD-10-CM | POA: Diagnosis not present

## 2022-03-24 ENCOUNTER — Ambulatory Visit: Payer: Medicare HMO | Admitting: Podiatry

## 2022-04-17 ENCOUNTER — Ambulatory Visit: Payer: Medicare HMO | Admitting: Podiatry

## 2022-04-21 DIAGNOSIS — H43393 Other vitreous opacities, bilateral: Secondary | ICD-10-CM | POA: Diagnosis not present

## 2022-04-21 DIAGNOSIS — Z7984 Long term (current) use of oral hypoglycemic drugs: Secondary | ICD-10-CM | POA: Diagnosis not present

## 2022-04-21 DIAGNOSIS — H35013 Changes in retinal vascular appearance, bilateral: Secondary | ICD-10-CM | POA: Diagnosis not present

## 2022-04-21 DIAGNOSIS — E119 Type 2 diabetes mellitus without complications: Secondary | ICD-10-CM | POA: Diagnosis not present

## 2022-04-21 DIAGNOSIS — H4913 Fourth [trochlear] nerve palsy, bilateral: Secondary | ICD-10-CM | POA: Diagnosis not present

## 2022-04-21 DIAGNOSIS — H2513 Age-related nuclear cataract, bilateral: Secondary | ICD-10-CM | POA: Diagnosis not present

## 2022-04-23 ENCOUNTER — Ambulatory Visit (INDEPENDENT_AMBULATORY_CARE_PROVIDER_SITE_OTHER): Payer: Medicare HMO

## 2022-04-23 DIAGNOSIS — I442 Atrioventricular block, complete: Secondary | ICD-10-CM | POA: Diagnosis not present

## 2022-04-23 LAB — CUP PACEART REMOTE DEVICE CHECK
Battery Remaining Longevity: 129 mo
Battery Voltage: 3.04 V
Brady Statistic AP VP Percent: 83.56 %
Brady Statistic AP VS Percent: 0.05 %
Brady Statistic AS VP Percent: 16.07 %
Brady Statistic AS VS Percent: 0.32 %
Brady Statistic RA Percent Paced: 83.73 %
Brady Statistic RV Percent Paced: 99.63 %
Date Time Interrogation Session: 20231108220654
Implantable Lead Connection Status: 753985
Implantable Lead Connection Status: 753985
Implantable Lead Implant Date: 20221109
Implantable Lead Implant Date: 20221109
Implantable Lead Location: 753859
Implantable Lead Location: 753860
Implantable Lead Model: 3830
Implantable Lead Model: 5076
Implantable Pulse Generator Implant Date: 20221109
Lead Channel Impedance Value: 323 Ohm
Lead Channel Impedance Value: 361 Ohm
Lead Channel Impedance Value: 475 Ohm
Lead Channel Impedance Value: 608 Ohm
Lead Channel Pacing Threshold Amplitude: 0.75 V
Lead Channel Pacing Threshold Amplitude: 1.125 V
Lead Channel Pacing Threshold Pulse Width: 0.4 ms
Lead Channel Pacing Threshold Pulse Width: 0.4 ms
Lead Channel Sensing Intrinsic Amplitude: 3.5 mV
Lead Channel Sensing Intrinsic Amplitude: 3.5 mV
Lead Channel Sensing Intrinsic Amplitude: 4.75 mV
Lead Channel Sensing Intrinsic Amplitude: 4.75 mV
Lead Channel Setting Pacing Amplitude: 1.5 V
Lead Channel Setting Pacing Amplitude: 2.25 V
Lead Channel Setting Pacing Pulse Width: 0.4 ms
Lead Channel Setting Sensing Sensitivity: 0.9 mV
Zone Setting Status: 755011
Zone Setting Status: 755011

## 2022-05-01 NOTE — Progress Notes (Signed)
Remote pacemaker transmission.   

## 2022-06-18 DIAGNOSIS — Z79891 Long term (current) use of opiate analgesic: Secondary | ICD-10-CM | POA: Diagnosis not present

## 2022-06-18 DIAGNOSIS — I5022 Chronic systolic (congestive) heart failure: Secondary | ICD-10-CM | POA: Diagnosis not present

## 2022-06-18 DIAGNOSIS — G894 Chronic pain syndrome: Secondary | ICD-10-CM | POA: Diagnosis not present

## 2022-06-18 DIAGNOSIS — F331 Major depressive disorder, recurrent, moderate: Secondary | ICD-10-CM | POA: Diagnosis not present

## 2022-06-18 DIAGNOSIS — E1142 Type 2 diabetes mellitus with diabetic polyneuropathy: Secondary | ICD-10-CM | POA: Diagnosis not present

## 2022-06-18 DIAGNOSIS — F419 Anxiety disorder, unspecified: Secondary | ICD-10-CM | POA: Diagnosis not present

## 2022-06-18 DIAGNOSIS — D510 Vitamin B12 deficiency anemia due to intrinsic factor deficiency: Secondary | ICD-10-CM | POA: Diagnosis not present

## 2022-06-18 DIAGNOSIS — E785 Hyperlipidemia, unspecified: Secondary | ICD-10-CM | POA: Diagnosis not present

## 2022-06-18 DIAGNOSIS — Z139 Encounter for screening, unspecified: Secondary | ICD-10-CM | POA: Diagnosis not present

## 2022-06-18 DIAGNOSIS — I10 Essential (primary) hypertension: Secondary | ICD-10-CM | POA: Diagnosis not present

## 2022-07-23 ENCOUNTER — Ambulatory Visit (INDEPENDENT_AMBULATORY_CARE_PROVIDER_SITE_OTHER): Payer: Medicare HMO

## 2022-07-23 DIAGNOSIS — I442 Atrioventricular block, complete: Secondary | ICD-10-CM | POA: Diagnosis not present

## 2022-07-23 LAB — CUP PACEART REMOTE DEVICE CHECK
Battery Remaining Longevity: 127 mo
Battery Voltage: 3.02 V
Brady Statistic AP VP Percent: 70.4 %
Brady Statistic AP VS Percent: 0.01 %
Brady Statistic AS VP Percent: 29.44 %
Brady Statistic AS VS Percent: 0.14 %
Brady Statistic RA Percent Paced: 70.46 %
Brady Statistic RV Percent Paced: 99.84 %
Date Time Interrogation Session: 20240208000237
Implantable Lead Connection Status: 753985
Implantable Lead Connection Status: 753985
Implantable Lead Implant Date: 20221109
Implantable Lead Implant Date: 20221109
Implantable Lead Location: 753859
Implantable Lead Location: 753860
Implantable Lead Model: 3830
Implantable Lead Model: 5076
Implantable Pulse Generator Implant Date: 20221109
Lead Channel Impedance Value: 361 Ohm
Lead Channel Impedance Value: 361 Ohm
Lead Channel Impedance Value: 456 Ohm
Lead Channel Impedance Value: 646 Ohm
Lead Channel Pacing Threshold Amplitude: 0.75 V
Lead Channel Pacing Threshold Amplitude: 1.125 V
Lead Channel Pacing Threshold Pulse Width: 0.4 ms
Lead Channel Pacing Threshold Pulse Width: 0.4 ms
Lead Channel Sensing Intrinsic Amplitude: 4.5 mV
Lead Channel Sensing Intrinsic Amplitude: 4.5 mV
Lead Channel Sensing Intrinsic Amplitude: 4.75 mV
Lead Channel Sensing Intrinsic Amplitude: 4.75 mV
Lead Channel Setting Pacing Amplitude: 1.5 V
Lead Channel Setting Pacing Amplitude: 2.25 V
Lead Channel Setting Pacing Pulse Width: 0.4 ms
Lead Channel Setting Sensing Sensitivity: 0.9 mV
Zone Setting Status: 755011
Zone Setting Status: 755011

## 2022-08-13 NOTE — Progress Notes (Signed)
Remote pacemaker transmission.   

## 2022-09-14 ENCOUNTER — Other Ambulatory Visit (HOSPITAL_BASED_OUTPATIENT_CLINIC_OR_DEPARTMENT_OTHER): Payer: Self-pay

## 2022-09-14 ENCOUNTER — Other Ambulatory Visit (HOSPITAL_COMMUNITY): Payer: Self-pay

## 2022-09-14 MED ORDER — HYDROMORPHONE HCL 4 MG PO TABS
4.0000 mg | ORAL_TABLET | Freq: Four times a day (QID) | ORAL | 0 refills | Status: DC
  Filled 2022-09-14: qty 120, 30d supply, fill #0

## 2022-09-17 DIAGNOSIS — F419 Anxiety disorder, unspecified: Secondary | ICD-10-CM | POA: Diagnosis not present

## 2022-09-17 DIAGNOSIS — E1142 Type 2 diabetes mellitus with diabetic polyneuropathy: Secondary | ICD-10-CM | POA: Diagnosis not present

## 2022-09-17 DIAGNOSIS — G894 Chronic pain syndrome: Secondary | ICD-10-CM | POA: Diagnosis not present

## 2022-09-17 DIAGNOSIS — I5022 Chronic systolic (congestive) heart failure: Secondary | ICD-10-CM | POA: Diagnosis not present

## 2022-09-17 DIAGNOSIS — E785 Hyperlipidemia, unspecified: Secondary | ICD-10-CM | POA: Diagnosis not present

## 2022-09-17 DIAGNOSIS — G2581 Restless legs syndrome: Secondary | ICD-10-CM | POA: Diagnosis not present

## 2022-09-17 DIAGNOSIS — Z125 Encounter for screening for malignant neoplasm of prostate: Secondary | ICD-10-CM | POA: Diagnosis not present

## 2022-09-17 DIAGNOSIS — D510 Vitamin B12 deficiency anemia due to intrinsic factor deficiency: Secondary | ICD-10-CM | POA: Diagnosis not present

## 2022-09-17 DIAGNOSIS — Z79891 Long term (current) use of opiate analgesic: Secondary | ICD-10-CM | POA: Diagnosis not present

## 2022-10-15 ENCOUNTER — Other Ambulatory Visit (HOSPITAL_BASED_OUTPATIENT_CLINIC_OR_DEPARTMENT_OTHER): Payer: Self-pay

## 2022-10-15 MED ORDER — HYDROMORPHONE HCL 8 MG PO TABS
4.0000 mg | ORAL_TABLET | Freq: Four times a day (QID) | ORAL | 0 refills | Status: AC
  Filled 2022-10-15: qty 60, 30d supply, fill #0

## 2022-10-22 ENCOUNTER — Ambulatory Visit (INDEPENDENT_AMBULATORY_CARE_PROVIDER_SITE_OTHER): Payer: Medicare HMO

## 2022-10-22 DIAGNOSIS — I442 Atrioventricular block, complete: Secondary | ICD-10-CM | POA: Diagnosis not present

## 2022-10-22 LAB — CUP PACEART REMOTE DEVICE CHECK
Battery Remaining Longevity: 131 mo
Battery Voltage: 3.02 V
Brady Statistic AP VP Percent: 82.09 %
Brady Statistic AP VS Percent: 0.01 %
Brady Statistic AS VP Percent: 17.71 %
Brady Statistic AS VS Percent: 0.2 %
Brady Statistic RA Percent Paced: 82.16 %
Brady Statistic RV Percent Paced: 99.79 %
Date Time Interrogation Session: 20240509065457
Implantable Lead Connection Status: 753985
Implantable Lead Connection Status: 753985
Implantable Lead Implant Date: 20221109
Implantable Lead Implant Date: 20221109
Implantable Lead Location: 753859
Implantable Lead Location: 753860
Implantable Lead Model: 3830
Implantable Lead Model: 5076
Implantable Pulse Generator Implant Date: 20221109
Lead Channel Impedance Value: 380 Ohm
Lead Channel Impedance Value: 380 Ohm
Lead Channel Impedance Value: 532 Ohm
Lead Channel Impedance Value: 589 Ohm
Lead Channel Pacing Threshold Amplitude: 0.875 V
Lead Channel Pacing Threshold Amplitude: 1 V
Lead Channel Pacing Threshold Pulse Width: 0.4 ms
Lead Channel Pacing Threshold Pulse Width: 0.4 ms
Lead Channel Sensing Intrinsic Amplitude: 16.25 mV
Lead Channel Sensing Intrinsic Amplitude: 16.25 mV
Lead Channel Sensing Intrinsic Amplitude: 4.375 mV
Lead Channel Sensing Intrinsic Amplitude: 4.375 mV
Lead Channel Setting Pacing Amplitude: 1.5 V
Lead Channel Setting Pacing Amplitude: 2 V
Lead Channel Setting Pacing Pulse Width: 0.4 ms
Lead Channel Setting Sensing Sensitivity: 0.9 mV
Zone Setting Status: 755011
Zone Setting Status: 755011

## 2022-11-02 ENCOUNTER — Other Ambulatory Visit: Payer: Self-pay | Admitting: Cardiology

## 2022-11-11 NOTE — Progress Notes (Signed)
Remote pacemaker transmission.   

## 2022-11-19 ENCOUNTER — Other Ambulatory Visit (HOSPITAL_BASED_OUTPATIENT_CLINIC_OR_DEPARTMENT_OTHER): Payer: Self-pay

## 2022-11-19 MED ORDER — HYDROMORPHONE HCL 4 MG PO TABS
4.0000 mg | ORAL_TABLET | Freq: Four times a day (QID) | ORAL | 0 refills | Status: DC
  Filled 2022-11-19 – 2022-11-24 (×2): qty 100, 25d supply, fill #0

## 2022-11-23 ENCOUNTER — Other Ambulatory Visit (HOSPITAL_BASED_OUTPATIENT_CLINIC_OR_DEPARTMENT_OTHER): Payer: Self-pay

## 2022-11-24 ENCOUNTER — Other Ambulatory Visit (HOSPITAL_BASED_OUTPATIENT_CLINIC_OR_DEPARTMENT_OTHER): Payer: Self-pay

## 2022-12-24 ENCOUNTER — Other Ambulatory Visit (HOSPITAL_BASED_OUTPATIENT_CLINIC_OR_DEPARTMENT_OTHER): Payer: Self-pay

## 2022-12-24 MED ORDER — HYDROMORPHONE HCL 4 MG PO TABS
4.0000 mg | ORAL_TABLET | Freq: Four times a day (QID) | ORAL | 0 refills | Status: DC
  Filled 2022-12-24: qty 100, 25d supply, fill #0

## 2022-12-29 DIAGNOSIS — F419 Anxiety disorder, unspecified: Secondary | ICD-10-CM | POA: Diagnosis not present

## 2022-12-29 DIAGNOSIS — I5022 Chronic systolic (congestive) heart failure: Secondary | ICD-10-CM | POA: Diagnosis not present

## 2022-12-29 DIAGNOSIS — F331 Major depressive disorder, recurrent, moderate: Secondary | ICD-10-CM | POA: Diagnosis not present

## 2022-12-29 DIAGNOSIS — Z79891 Long term (current) use of opiate analgesic: Secondary | ICD-10-CM | POA: Diagnosis not present

## 2022-12-29 DIAGNOSIS — D510 Vitamin B12 deficiency anemia due to intrinsic factor deficiency: Secondary | ICD-10-CM | POA: Diagnosis not present

## 2022-12-29 DIAGNOSIS — I10 Essential (primary) hypertension: Secondary | ICD-10-CM | POA: Diagnosis not present

## 2022-12-29 DIAGNOSIS — E1142 Type 2 diabetes mellitus with diabetic polyneuropathy: Secondary | ICD-10-CM | POA: Diagnosis not present

## 2022-12-29 DIAGNOSIS — E785 Hyperlipidemia, unspecified: Secondary | ICD-10-CM | POA: Diagnosis not present

## 2022-12-29 DIAGNOSIS — G894 Chronic pain syndrome: Secondary | ICD-10-CM | POA: Diagnosis not present

## 2023-01-21 ENCOUNTER — Ambulatory Visit (INDEPENDENT_AMBULATORY_CARE_PROVIDER_SITE_OTHER): Payer: Medicare HMO

## 2023-01-21 DIAGNOSIS — I442 Atrioventricular block, complete: Secondary | ICD-10-CM

## 2023-01-25 ENCOUNTER — Other Ambulatory Visit (HOSPITAL_BASED_OUTPATIENT_CLINIC_OR_DEPARTMENT_OTHER): Payer: Self-pay

## 2023-01-25 MED ORDER — HYDROMORPHONE HCL 4 MG PO TABS
4.0000 mg | ORAL_TABLET | Freq: Four times a day (QID) | ORAL | 0 refills | Status: DC
  Filled 2023-01-25: qty 100, 25d supply, fill #0

## 2023-01-26 ENCOUNTER — Other Ambulatory Visit (HOSPITAL_BASED_OUTPATIENT_CLINIC_OR_DEPARTMENT_OTHER): Payer: Self-pay

## 2023-02-10 ENCOUNTER — Other Ambulatory Visit: Payer: Self-pay | Admitting: Cardiology

## 2023-02-26 ENCOUNTER — Other Ambulatory Visit (HOSPITAL_BASED_OUTPATIENT_CLINIC_OR_DEPARTMENT_OTHER): Payer: Self-pay

## 2023-02-26 MED ORDER — HYDROMORPHONE HCL 4 MG PO TABS
4.0000 mg | ORAL_TABLET | Freq: Four times a day (QID) | ORAL | 0 refills | Status: DC
  Filled 2023-02-26: qty 100, 25d supply, fill #0

## 2023-03-01 ENCOUNTER — Other Ambulatory Visit (HOSPITAL_BASED_OUTPATIENT_CLINIC_OR_DEPARTMENT_OTHER): Payer: Self-pay

## 2023-03-09 NOTE — Progress Notes (Signed)
Remote pacemaker transmission.   

## 2023-03-26 ENCOUNTER — Other Ambulatory Visit (HOSPITAL_BASED_OUTPATIENT_CLINIC_OR_DEPARTMENT_OTHER): Payer: Self-pay

## 2023-03-26 MED ORDER — HYDROMORPHONE HCL 4 MG PO TABS
4.0000 mg | ORAL_TABLET | Freq: Four times a day (QID) | ORAL | 0 refills | Status: DC
  Filled 2023-03-26: qty 100, 25d supply, fill #0

## 2023-04-07 DIAGNOSIS — G894 Chronic pain syndrome: Secondary | ICD-10-CM | POA: Diagnosis not present

## 2023-04-07 DIAGNOSIS — D510 Vitamin B12 deficiency anemia due to intrinsic factor deficiency: Secondary | ICD-10-CM | POA: Diagnosis not present

## 2023-04-07 DIAGNOSIS — F419 Anxiety disorder, unspecified: Secondary | ICD-10-CM | POA: Diagnosis not present

## 2023-04-07 DIAGNOSIS — I10 Essential (primary) hypertension: Secondary | ICD-10-CM | POA: Diagnosis not present

## 2023-04-07 DIAGNOSIS — G2581 Restless legs syndrome: Secondary | ICD-10-CM | POA: Diagnosis not present

## 2023-04-07 DIAGNOSIS — E1142 Type 2 diabetes mellitus with diabetic polyneuropathy: Secondary | ICD-10-CM | POA: Diagnosis not present

## 2023-04-07 DIAGNOSIS — F331 Major depressive disorder, recurrent, moderate: Secondary | ICD-10-CM | POA: Diagnosis not present

## 2023-04-07 DIAGNOSIS — E785 Hyperlipidemia, unspecified: Secondary | ICD-10-CM | POA: Diagnosis not present

## 2023-04-07 DIAGNOSIS — I5022 Chronic systolic (congestive) heart failure: Secondary | ICD-10-CM | POA: Diagnosis not present

## 2023-04-22 ENCOUNTER — Ambulatory Visit (INDEPENDENT_AMBULATORY_CARE_PROVIDER_SITE_OTHER): Payer: Medicare HMO

## 2023-04-22 DIAGNOSIS — I442 Atrioventricular block, complete: Secondary | ICD-10-CM | POA: Diagnosis not present

## 2023-04-22 LAB — CUP PACEART REMOTE DEVICE CHECK
Battery Remaining Longevity: 125 mo
Battery Voltage: 3.01 V
Brady Statistic AP VP Percent: 82.61 %
Brady Statistic AP VS Percent: 0.03 %
Brady Statistic AS VP Percent: 16.83 %
Brady Statistic AS VS Percent: 0.53 %
Brady Statistic RA Percent Paced: 82.93 %
Brady Statistic RV Percent Paced: 99.44 %
Date Time Interrogation Session: 20241106201333
Implantable Lead Connection Status: 753985
Implantable Lead Connection Status: 753985
Implantable Lead Implant Date: 20221109
Implantable Lead Implant Date: 20221109
Implantable Lead Location: 753859
Implantable Lead Location: 753860
Implantable Lead Model: 3830
Implantable Lead Model: 5076
Implantable Pulse Generator Implant Date: 20221109
Lead Channel Impedance Value: 361 Ohm
Lead Channel Impedance Value: 380 Ohm
Lead Channel Impedance Value: 532 Ohm
Lead Channel Impedance Value: 608 Ohm
Lead Channel Pacing Threshold Amplitude: 0.625 V
Lead Channel Pacing Threshold Amplitude: 1 V
Lead Channel Pacing Threshold Pulse Width: 0.4 ms
Lead Channel Pacing Threshold Pulse Width: 0.4 ms
Lead Channel Sensing Intrinsic Amplitude: 3.875 mV
Lead Channel Sensing Intrinsic Amplitude: 3.875 mV
Lead Channel Sensing Intrinsic Amplitude: 6.625 mV
Lead Channel Sensing Intrinsic Amplitude: 6.625 mV
Lead Channel Setting Pacing Amplitude: 1.5 V
Lead Channel Setting Pacing Amplitude: 2 V
Lead Channel Setting Pacing Pulse Width: 0.4 ms
Lead Channel Setting Sensing Sensitivity: 0.9 mV
Zone Setting Status: 755011
Zone Setting Status: 755011

## 2023-04-26 ENCOUNTER — Other Ambulatory Visit (HOSPITAL_BASED_OUTPATIENT_CLINIC_OR_DEPARTMENT_OTHER): Payer: Self-pay

## 2023-04-26 ENCOUNTER — Other Ambulatory Visit: Payer: Self-pay

## 2023-04-26 MED ORDER — HYDROMORPHONE HCL 4 MG PO TABS
4.0000 mg | ORAL_TABLET | Freq: Four times a day (QID) | ORAL | 0 refills | Status: AC
  Filled 2023-04-26: qty 100, 25d supply, fill #0

## 2023-04-27 ENCOUNTER — Other Ambulatory Visit: Payer: Self-pay

## 2023-04-29 ENCOUNTER — Other Ambulatory Visit (HOSPITAL_BASED_OUTPATIENT_CLINIC_OR_DEPARTMENT_OTHER): Payer: Self-pay

## 2023-05-03 ENCOUNTER — Other Ambulatory Visit: Payer: Self-pay

## 2023-05-10 NOTE — Progress Notes (Signed)
Remote pacemaker transmission.   

## 2023-05-11 ENCOUNTER — Other Ambulatory Visit (HOSPITAL_COMMUNITY): Payer: Self-pay

## 2023-05-11 ENCOUNTER — Other Ambulatory Visit (HOSPITAL_BASED_OUTPATIENT_CLINIC_OR_DEPARTMENT_OTHER): Payer: Self-pay

## 2023-05-14 ENCOUNTER — Other Ambulatory Visit: Payer: Self-pay

## 2023-06-14 DIAGNOSIS — R059 Cough, unspecified: Secondary | ICD-10-CM | POA: Diagnosis not present

## 2023-06-14 DIAGNOSIS — B974 Respiratory syncytial virus as the cause of diseases classified elsewhere: Secondary | ICD-10-CM | POA: Diagnosis not present

## 2023-06-14 DIAGNOSIS — J449 Chronic obstructive pulmonary disease, unspecified: Secondary | ICD-10-CM | POA: Diagnosis not present

## 2023-06-14 DIAGNOSIS — J9601 Acute respiratory failure with hypoxia: Secondary | ICD-10-CM | POA: Diagnosis not present

## 2023-06-14 DIAGNOSIS — B338 Other specified viral diseases: Secondary | ICD-10-CM | POA: Diagnosis not present

## 2023-06-14 DIAGNOSIS — Z20822 Contact with and (suspected) exposure to covid-19: Secondary | ICD-10-CM | POA: Diagnosis not present

## 2023-06-14 DIAGNOSIS — R918 Other nonspecific abnormal finding of lung field: Secondary | ICD-10-CM | POA: Diagnosis not present

## 2023-06-14 DIAGNOSIS — Z9981 Dependence on supplemental oxygen: Secondary | ICD-10-CM | POA: Diagnosis not present

## 2023-06-18 ENCOUNTER — Other Ambulatory Visit (HOSPITAL_BASED_OUTPATIENT_CLINIC_OR_DEPARTMENT_OTHER): Payer: Self-pay

## 2023-06-18 MED ORDER — HYDROMORPHONE HCL 8 MG PO TABS
4.0000 mg | ORAL_TABLET | Freq: Four times a day (QID) | ORAL | 0 refills | Status: DC
  Filled 2023-06-18: qty 60, 30d supply, fill #0

## 2023-07-09 DIAGNOSIS — I5022 Chronic systolic (congestive) heart failure: Secondary | ICD-10-CM | POA: Diagnosis not present

## 2023-07-09 DIAGNOSIS — E785 Hyperlipidemia, unspecified: Secondary | ICD-10-CM | POA: Diagnosis not present

## 2023-07-09 DIAGNOSIS — F331 Major depressive disorder, recurrent, moderate: Secondary | ICD-10-CM | POA: Diagnosis not present

## 2023-07-09 DIAGNOSIS — F419 Anxiety disorder, unspecified: Secondary | ICD-10-CM | POA: Diagnosis not present

## 2023-07-09 DIAGNOSIS — G894 Chronic pain syndrome: Secondary | ICD-10-CM | POA: Diagnosis not present

## 2023-07-09 DIAGNOSIS — Z139 Encounter for screening, unspecified: Secondary | ICD-10-CM | POA: Diagnosis not present

## 2023-07-09 DIAGNOSIS — E1142 Type 2 diabetes mellitus with diabetic polyneuropathy: Secondary | ICD-10-CM | POA: Diagnosis not present

## 2023-07-09 DIAGNOSIS — I10 Essential (primary) hypertension: Secondary | ICD-10-CM | POA: Diagnosis not present

## 2023-07-09 DIAGNOSIS — D510 Vitamin B12 deficiency anemia due to intrinsic factor deficiency: Secondary | ICD-10-CM | POA: Diagnosis not present

## 2023-07-21 ENCOUNTER — Other Ambulatory Visit (HOSPITAL_BASED_OUTPATIENT_CLINIC_OR_DEPARTMENT_OTHER): Payer: Self-pay

## 2023-07-21 MED ORDER — HYDROMORPHONE HCL 8 MG PO TABS
4.0000 mg | ORAL_TABLET | Freq: Four times a day (QID) | ORAL | 0 refills | Status: AC
  Filled 2023-07-21: qty 60, 30d supply, fill #0

## 2023-07-22 ENCOUNTER — Ambulatory Visit (INDEPENDENT_AMBULATORY_CARE_PROVIDER_SITE_OTHER): Payer: Medicare HMO

## 2023-07-22 DIAGNOSIS — I442 Atrioventricular block, complete: Secondary | ICD-10-CM | POA: Diagnosis not present

## 2023-07-22 LAB — CUP PACEART REMOTE DEVICE CHECK
Battery Remaining Longevity: 121 mo
Battery Voltage: 3.01 V
Brady Statistic AP VP Percent: 78.92 %
Brady Statistic AP VS Percent: 0.03 %
Brady Statistic AS VP Percent: 20.61 %
Brady Statistic AS VS Percent: 0.44 %
Brady Statistic RA Percent Paced: 79.16 %
Brady Statistic RV Percent Paced: 99.53 %
Date Time Interrogation Session: 20250206030847
Implantable Lead Connection Status: 753985
Implantable Lead Connection Status: 753985
Implantable Lead Implant Date: 20221109
Implantable Lead Implant Date: 20221109
Implantable Lead Location: 753859
Implantable Lead Location: 753860
Implantable Lead Model: 3830
Implantable Lead Model: 5076
Implantable Pulse Generator Implant Date: 20221109
Lead Channel Impedance Value: 342 Ohm
Lead Channel Impedance Value: 361 Ohm
Lead Channel Impedance Value: 475 Ohm
Lead Channel Impedance Value: 589 Ohm
Lead Channel Pacing Threshold Amplitude: 0.625 V
Lead Channel Pacing Threshold Amplitude: 0.875 V
Lead Channel Pacing Threshold Pulse Width: 0.4 ms
Lead Channel Pacing Threshold Pulse Width: 0.4 ms
Lead Channel Sensing Intrinsic Amplitude: 3.75 mV
Lead Channel Sensing Intrinsic Amplitude: 3.75 mV
Lead Channel Sensing Intrinsic Amplitude: 4 mV
Lead Channel Sensing Intrinsic Amplitude: 4 mV
Lead Channel Setting Pacing Amplitude: 1.5 V
Lead Channel Setting Pacing Amplitude: 2 V
Lead Channel Setting Pacing Pulse Width: 0.4 ms
Lead Channel Setting Sensing Sensitivity: 0.9 mV
Zone Setting Status: 755011
Zone Setting Status: 755011

## 2023-08-03 ENCOUNTER — Telehealth: Payer: Self-pay | Admitting: *Deleted

## 2023-08-03 NOTE — Telephone Encounter (Signed)
 Left message to call back

## 2023-08-03 NOTE — Telephone Encounter (Signed)
-----   Message from [facility] sent at 07/30/2023  3:13 PM EST ----- Abnormal device interrogation reviewed.  Lead parameters and battery status stable.  NSVT noted. Increase toprol xl to 100 mg and see ep app in clinic.

## 2023-08-19 ENCOUNTER — Other Ambulatory Visit (HOSPITAL_BASED_OUTPATIENT_CLINIC_OR_DEPARTMENT_OTHER): Payer: Self-pay

## 2023-08-19 MED ORDER — HYDROMORPHONE HCL 4 MG PO TABS
2.0000 mg | ORAL_TABLET | Freq: Four times a day (QID) | ORAL | 0 refills | Status: DC
  Filled 2023-08-19: qty 120, 60d supply, fill #0

## 2023-08-20 ENCOUNTER — Other Ambulatory Visit (HOSPITAL_BASED_OUTPATIENT_CLINIC_OR_DEPARTMENT_OTHER): Payer: Self-pay

## 2023-08-20 MED ORDER — HYDROMORPHONE HCL 4 MG PO TABS
4.0000 mg | ORAL_TABLET | Freq: Four times a day (QID) | ORAL | 0 refills | Status: DC
  Filled 2023-08-20: qty 120, 30d supply, fill #0

## 2023-08-26 NOTE — Progress Notes (Signed)
 Remote pacemaker transmission.

## 2023-09-21 ENCOUNTER — Other Ambulatory Visit (HOSPITAL_BASED_OUTPATIENT_CLINIC_OR_DEPARTMENT_OTHER): Payer: Self-pay

## 2023-09-21 MED ORDER — HYDROMORPHONE HCL 4 MG PO TABS
4.0000 mg | ORAL_TABLET | Freq: Four times a day (QID) | ORAL | 0 refills | Status: DC
  Filled 2023-09-21: qty 120, 30d supply, fill #0

## 2023-10-15 DIAGNOSIS — G2581 Restless legs syndrome: Secondary | ICD-10-CM | POA: Diagnosis not present

## 2023-10-15 DIAGNOSIS — E1142 Type 2 diabetes mellitus with diabetic polyneuropathy: Secondary | ICD-10-CM | POA: Diagnosis not present

## 2023-10-15 DIAGNOSIS — Z79891 Long term (current) use of opiate analgesic: Secondary | ICD-10-CM | POA: Diagnosis not present

## 2023-10-15 DIAGNOSIS — D510 Vitamin B12 deficiency anemia due to intrinsic factor deficiency: Secondary | ICD-10-CM | POA: Diagnosis not present

## 2023-10-15 DIAGNOSIS — I10 Essential (primary) hypertension: Secondary | ICD-10-CM | POA: Diagnosis not present

## 2023-10-15 DIAGNOSIS — F419 Anxiety disorder, unspecified: Secondary | ICD-10-CM | POA: Diagnosis not present

## 2023-10-15 DIAGNOSIS — G894 Chronic pain syndrome: Secondary | ICD-10-CM | POA: Diagnosis not present

## 2023-10-15 DIAGNOSIS — E785 Hyperlipidemia, unspecified: Secondary | ICD-10-CM | POA: Diagnosis not present

## 2023-10-21 ENCOUNTER — Ambulatory Visit (INDEPENDENT_AMBULATORY_CARE_PROVIDER_SITE_OTHER): Payer: Medicare HMO

## 2023-10-21 ENCOUNTER — Other Ambulatory Visit (HOSPITAL_BASED_OUTPATIENT_CLINIC_OR_DEPARTMENT_OTHER): Payer: Self-pay

## 2023-10-21 DIAGNOSIS — I442 Atrioventricular block, complete: Secondary | ICD-10-CM | POA: Diagnosis not present

## 2023-10-21 LAB — CUP PACEART REMOTE DEVICE CHECK
Battery Remaining Longevity: 117 mo
Battery Voltage: 3.01 V
Brady Statistic AP VP Percent: 86.85 %
Brady Statistic AP VS Percent: 0.02 %
Brady Statistic AS VP Percent: 13.08 %
Brady Statistic AS VS Percent: 0.06 %
Brady Statistic RA Percent Paced: 86.88 %
Brady Statistic RV Percent Paced: 99.93 %
Date Time Interrogation Session: 20250507224019
Implantable Lead Connection Status: 753985
Implantable Lead Connection Status: 753985
Implantable Lead Implant Date: 20221109
Implantable Lead Implant Date: 20221109
Implantable Lead Location: 753859
Implantable Lead Location: 753860
Implantable Lead Model: 3830
Implantable Lead Model: 5076
Implantable Pulse Generator Implant Date: 20221109
Lead Channel Impedance Value: 342 Ohm
Lead Channel Impedance Value: 361 Ohm
Lead Channel Impedance Value: 456 Ohm
Lead Channel Impedance Value: 589 Ohm
Lead Channel Pacing Threshold Amplitude: 0.625 V
Lead Channel Pacing Threshold Amplitude: 1 V
Lead Channel Pacing Threshold Pulse Width: 0.4 ms
Lead Channel Pacing Threshold Pulse Width: 0.4 ms
Lead Channel Sensing Intrinsic Amplitude: 3.25 mV
Lead Channel Sensing Intrinsic Amplitude: 3.25 mV
Lead Channel Sensing Intrinsic Amplitude: 9 mV
Lead Channel Sensing Intrinsic Amplitude: 9 mV
Lead Channel Setting Pacing Amplitude: 1.5 V
Lead Channel Setting Pacing Amplitude: 2 V
Lead Channel Setting Pacing Pulse Width: 0.4 ms
Lead Channel Setting Sensing Sensitivity: 0.9 mV
Zone Setting Status: 755011
Zone Setting Status: 755011

## 2023-10-21 MED ORDER — HYDROMORPHONE HCL 4 MG PO TABS
4.0000 mg | ORAL_TABLET | Freq: Four times a day (QID) | ORAL | 0 refills | Status: DC
  Filled 2023-10-21: qty 120, 30d supply, fill #0

## 2023-11-22 ENCOUNTER — Other Ambulatory Visit (HOSPITAL_BASED_OUTPATIENT_CLINIC_OR_DEPARTMENT_OTHER): Payer: Self-pay

## 2023-11-22 MED ORDER — HYDROMORPHONE HCL 4 MG PO TABS
4.0000 mg | ORAL_TABLET | Freq: Four times a day (QID) | ORAL | 0 refills | Status: DC
  Filled 2023-11-22: qty 120, 30d supply, fill #0

## 2023-11-29 NOTE — Progress Notes (Signed)
 Remote pacemaker transmission.

## 2023-12-21 ENCOUNTER — Other Ambulatory Visit (HOSPITAL_BASED_OUTPATIENT_CLINIC_OR_DEPARTMENT_OTHER): Payer: Self-pay

## 2023-12-21 MED ORDER — HYDROMORPHONE HCL 4 MG PO TABS
4.0000 mg | ORAL_TABLET | Freq: Four times a day (QID) | ORAL | 0 refills | Status: DC
  Filled 2023-12-21: qty 120, 30d supply, fill #0

## 2023-12-22 ENCOUNTER — Other Ambulatory Visit (HOSPITAL_BASED_OUTPATIENT_CLINIC_OR_DEPARTMENT_OTHER): Payer: Self-pay

## 2023-12-29 ENCOUNTER — Other Ambulatory Visit: Payer: Self-pay | Admitting: Cardiovascular Disease

## 2024-01-20 ENCOUNTER — Ambulatory Visit

## 2024-01-20 ENCOUNTER — Other Ambulatory Visit: Payer: Self-pay | Admitting: Cardiovascular Disease

## 2024-01-20 DIAGNOSIS — I442 Atrioventricular block, complete: Secondary | ICD-10-CM

## 2024-01-20 LAB — CUP PACEART REMOTE DEVICE CHECK
Battery Remaining Longevity: 115 mo
Battery Voltage: 3 V
Brady Statistic AP VP Percent: 87.56 %
Brady Statistic AP VS Percent: 0.05 %
Brady Statistic AS VP Percent: 12.17 %
Brady Statistic AS VS Percent: 0.23 %
Brady Statistic RA Percent Paced: 87.74 %
Brady Statistic RV Percent Paced: 99.73 %
Date Time Interrogation Session: 20250807010619
Implantable Lead Connection Status: 753985
Implantable Lead Connection Status: 753985
Implantable Lead Implant Date: 20221109
Implantable Lead Implant Date: 20221109
Implantable Lead Location: 753859
Implantable Lead Location: 753860
Implantable Lead Model: 3830
Implantable Lead Model: 5076
Implantable Pulse Generator Implant Date: 20221109
Lead Channel Impedance Value: 342 Ohm
Lead Channel Impedance Value: 361 Ohm
Lead Channel Impedance Value: 456 Ohm
Lead Channel Impedance Value: 608 Ohm
Lead Channel Pacing Threshold Amplitude: 0.625 V
Lead Channel Pacing Threshold Amplitude: 0.875 V
Lead Channel Pacing Threshold Pulse Width: 0.4 ms
Lead Channel Pacing Threshold Pulse Width: 0.4 ms
Lead Channel Sensing Intrinsic Amplitude: 24.875 mV
Lead Channel Sensing Intrinsic Amplitude: 24.875 mV
Lead Channel Sensing Intrinsic Amplitude: 3.25 mV
Lead Channel Sensing Intrinsic Amplitude: 3.25 mV
Lead Channel Setting Pacing Amplitude: 1.5 V
Lead Channel Setting Pacing Amplitude: 2 V
Lead Channel Setting Pacing Pulse Width: 0.4 ms
Lead Channel Setting Sensing Sensitivity: 0.9 mV
Zone Setting Status: 755011
Zone Setting Status: 755011

## 2024-01-24 ENCOUNTER — Ambulatory Visit: Payer: Self-pay | Admitting: Cardiology

## 2024-01-24 ENCOUNTER — Other Ambulatory Visit (HOSPITAL_BASED_OUTPATIENT_CLINIC_OR_DEPARTMENT_OTHER): Payer: Self-pay

## 2024-01-24 DIAGNOSIS — Z125 Encounter for screening for malignant neoplasm of prostate: Secondary | ICD-10-CM | POA: Diagnosis not present

## 2024-01-24 DIAGNOSIS — D510 Vitamin B12 deficiency anemia due to intrinsic factor deficiency: Secondary | ICD-10-CM | POA: Diagnosis not present

## 2024-01-24 DIAGNOSIS — I10 Essential (primary) hypertension: Secondary | ICD-10-CM | POA: Diagnosis not present

## 2024-01-24 DIAGNOSIS — E1142 Type 2 diabetes mellitus with diabetic polyneuropathy: Secondary | ICD-10-CM | POA: Diagnosis not present

## 2024-01-24 DIAGNOSIS — G2581 Restless legs syndrome: Secondary | ICD-10-CM | POA: Diagnosis not present

## 2024-01-24 DIAGNOSIS — Z79891 Long term (current) use of opiate analgesic: Secondary | ICD-10-CM | POA: Diagnosis not present

## 2024-01-24 DIAGNOSIS — E785 Hyperlipidemia, unspecified: Secondary | ICD-10-CM | POA: Diagnosis not present

## 2024-01-24 DIAGNOSIS — Z9181 History of falling: Secondary | ICD-10-CM | POA: Diagnosis not present

## 2024-01-24 DIAGNOSIS — G894 Chronic pain syndrome: Secondary | ICD-10-CM | POA: Diagnosis not present

## 2024-01-24 DIAGNOSIS — F419 Anxiety disorder, unspecified: Secondary | ICD-10-CM | POA: Diagnosis not present

## 2024-01-24 MED ORDER — HYDROMORPHONE HCL 4 MG PO TABS
4.0000 mg | ORAL_TABLET | Freq: Four times a day (QID) | ORAL | 0 refills | Status: DC
  Filled 2024-01-24: qty 120, 30d supply, fill #0

## 2024-02-02 DIAGNOSIS — H35013 Changes in retinal vascular appearance, bilateral: Secondary | ICD-10-CM | POA: Diagnosis not present

## 2024-02-02 DIAGNOSIS — E119 Type 2 diabetes mellitus without complications: Secondary | ICD-10-CM | POA: Diagnosis not present

## 2024-02-02 DIAGNOSIS — Z7984 Long term (current) use of oral hypoglycemic drugs: Secondary | ICD-10-CM | POA: Diagnosis not present

## 2024-02-02 DIAGNOSIS — H43393 Other vitreous opacities, bilateral: Secondary | ICD-10-CM | POA: Diagnosis not present

## 2024-02-02 DIAGNOSIS — H524 Presbyopia: Secondary | ICD-10-CM | POA: Diagnosis not present

## 2024-02-02 DIAGNOSIS — H2513 Age-related nuclear cataract, bilateral: Secondary | ICD-10-CM | POA: Diagnosis not present

## 2024-02-28 ENCOUNTER — Other Ambulatory Visit (HOSPITAL_BASED_OUTPATIENT_CLINIC_OR_DEPARTMENT_OTHER): Payer: Self-pay

## 2024-02-28 MED ORDER — HYDROMORPHONE HCL 4 MG PO TABS
4.0000 mg | ORAL_TABLET | Freq: Four times a day (QID) | ORAL | 0 refills | Status: DC
  Filled 2024-02-28: qty 120, 30d supply, fill #0

## 2024-03-11 NOTE — Progress Notes (Signed)
 Remote PPM Transmission

## 2024-03-29 ENCOUNTER — Other Ambulatory Visit (HOSPITAL_BASED_OUTPATIENT_CLINIC_OR_DEPARTMENT_OTHER): Payer: Self-pay

## 2024-03-29 MED ORDER — HYDROMORPHONE HCL 4 MG PO TABS
4.0000 mg | ORAL_TABLET | Freq: Four times a day (QID) | ORAL | 0 refills | Status: DC
  Filled 2024-03-29: qty 120, 30d supply, fill #0

## 2024-04-20 ENCOUNTER — Ambulatory Visit

## 2024-04-20 DIAGNOSIS — I442 Atrioventricular block, complete: Secondary | ICD-10-CM | POA: Diagnosis not present

## 2024-04-20 LAB — CUP PACEART REMOTE DEVICE CHECK
Battery Remaining Longevity: 110 mo
Battery Voltage: 3 V
Brady Statistic AP VP Percent: 88.13 %
Brady Statistic AP VS Percent: 0.14 %
Brady Statistic AS VP Percent: 11.47 %
Brady Statistic AS VS Percent: 0.26 %
Brady Statistic RA Percent Paced: 88.38 %
Brady Statistic RV Percent Paced: 99.6 %
Date Time Interrogation Session: 20251106034110
Implantable Lead Connection Status: 753985
Implantable Lead Connection Status: 753985
Implantable Lead Implant Date: 20221109
Implantable Lead Implant Date: 20221109
Implantable Lead Location: 753859
Implantable Lead Location: 753860
Implantable Lead Model: 3830
Implantable Lead Model: 5076
Implantable Pulse Generator Implant Date: 20221109
Lead Channel Impedance Value: 361 Ohm
Lead Channel Impedance Value: 380 Ohm
Lead Channel Impedance Value: 494 Ohm
Lead Channel Impedance Value: 532 Ohm
Lead Channel Pacing Threshold Amplitude: 0.75 V
Lead Channel Pacing Threshold Amplitude: 0.875 V
Lead Channel Pacing Threshold Pulse Width: 0.4 ms
Lead Channel Pacing Threshold Pulse Width: 0.4 ms
Lead Channel Sensing Intrinsic Amplitude: 3.875 mV
Lead Channel Sensing Intrinsic Amplitude: 3.875 mV
Lead Channel Sensing Intrinsic Amplitude: 4 mV
Lead Channel Sensing Intrinsic Amplitude: 4 mV
Lead Channel Setting Pacing Amplitude: 1.5 V
Lead Channel Setting Pacing Amplitude: 2 V
Lead Channel Setting Pacing Pulse Width: 0.4 ms
Lead Channel Setting Sensing Sensitivity: 0.9 mV
Zone Setting Status: 755011
Zone Setting Status: 755011

## 2024-04-25 ENCOUNTER — Ambulatory Visit: Payer: Self-pay | Admitting: Cardiology

## 2024-04-25 DIAGNOSIS — G2581 Restless legs syndrome: Secondary | ICD-10-CM | POA: Diagnosis not present

## 2024-04-25 DIAGNOSIS — G894 Chronic pain syndrome: Secondary | ICD-10-CM | POA: Diagnosis not present

## 2024-04-25 DIAGNOSIS — I10 Essential (primary) hypertension: Secondary | ICD-10-CM | POA: Diagnosis not present

## 2024-04-25 DIAGNOSIS — F419 Anxiety disorder, unspecified: Secondary | ICD-10-CM | POA: Diagnosis not present

## 2024-04-25 DIAGNOSIS — E1142 Type 2 diabetes mellitus with diabetic polyneuropathy: Secondary | ICD-10-CM | POA: Diagnosis not present

## 2024-04-25 DIAGNOSIS — I5022 Chronic systolic (congestive) heart failure: Secondary | ICD-10-CM | POA: Diagnosis not present

## 2024-04-25 DIAGNOSIS — Z79891 Long term (current) use of opiate analgesic: Secondary | ICD-10-CM | POA: Diagnosis not present

## 2024-04-25 DIAGNOSIS — E785 Hyperlipidemia, unspecified: Secondary | ICD-10-CM | POA: Diagnosis not present

## 2024-04-25 DIAGNOSIS — D510 Vitamin B12 deficiency anemia due to intrinsic factor deficiency: Secondary | ICD-10-CM | POA: Diagnosis not present

## 2024-04-25 NOTE — Progress Notes (Signed)
 Remote PPM Transmission

## 2024-04-26 ENCOUNTER — Other Ambulatory Visit (HOSPITAL_BASED_OUTPATIENT_CLINIC_OR_DEPARTMENT_OTHER): Payer: Self-pay

## 2024-04-26 MED ORDER — INSULIN LISPRO (1 UNIT DIAL) 100 UNIT/ML (KWIKPEN)
5.0000 [IU] | PEN_INJECTOR | Freq: Three times a day (TID) | SUBCUTANEOUS | 3 refills | Status: AC
Start: 2024-04-26 — End: ?
  Filled 2024-04-26: qty 6, 40d supply, fill #0

## 2024-04-27 ENCOUNTER — Other Ambulatory Visit (HOSPITAL_BASED_OUTPATIENT_CLINIC_OR_DEPARTMENT_OTHER): Payer: Self-pay

## 2024-05-01 ENCOUNTER — Other Ambulatory Visit (HOSPITAL_BASED_OUTPATIENT_CLINIC_OR_DEPARTMENT_OTHER): Payer: Self-pay

## 2024-05-01 MED ORDER — HYDROMORPHONE HCL 4 MG PO TABS
4.0000 mg | ORAL_TABLET | Freq: Four times a day (QID) | ORAL | 0 refills | Status: DC
  Filled 2024-05-01: qty 120, 30d supply, fill #0

## 2024-05-31 ENCOUNTER — Other Ambulatory Visit (HOSPITAL_BASED_OUTPATIENT_CLINIC_OR_DEPARTMENT_OTHER): Payer: Self-pay

## 2024-05-31 MED ORDER — HYDROMORPHONE HCL 4 MG PO TABS
4.0000 mg | ORAL_TABLET | Freq: Four times a day (QID) | ORAL | 0 refills | Status: DC
  Filled 2024-05-31: qty 120, 30d supply, fill #0

## 2024-06-06 ENCOUNTER — Other Ambulatory Visit (HOSPITAL_BASED_OUTPATIENT_CLINIC_OR_DEPARTMENT_OTHER): Payer: Self-pay

## 2024-06-06 MED ORDER — INSULIN LISPRO (1 UNIT DIAL) 100 UNIT/ML (KWIKPEN)
5.0000 [IU] | PEN_INJECTOR | Freq: Three times a day (TID) | SUBCUTANEOUS | 3 refills | Status: AC
  Filled 2024-06-06: qty 15, 100d supply, fill #0

## 2024-06-19 ENCOUNTER — Other Ambulatory Visit (HOSPITAL_BASED_OUTPATIENT_CLINIC_OR_DEPARTMENT_OTHER): Payer: Self-pay

## 2024-07-04 ENCOUNTER — Other Ambulatory Visit: Payer: Self-pay

## 2024-07-04 ENCOUNTER — Other Ambulatory Visit (HOSPITAL_BASED_OUTPATIENT_CLINIC_OR_DEPARTMENT_OTHER): Payer: Self-pay

## 2024-07-04 MED ORDER — HYDROMORPHONE HCL 4 MG PO TABS
4.0000 mg | ORAL_TABLET | Freq: Four times a day (QID) | ORAL | 0 refills | Status: AC
  Filled 2024-07-04: qty 120, 30d supply, fill #0

## 2024-07-20 ENCOUNTER — Ambulatory Visit

## 2024-07-21 ENCOUNTER — Telehealth: Payer: Self-pay

## 2024-07-21 LAB — CUP PACEART REMOTE DEVICE CHECK
Battery Remaining Longevity: 108 mo
Battery Voltage: 3 V
Brady Statistic AP VP Percent: 76.8 %
Brady Statistic AP VS Percent: 0.19 %
Brady Statistic AS VP Percent: 22.2 %
Brady Statistic AS VS Percent: 0.81 %
Brady Statistic RA Percent Paced: 77.52 %
Brady Statistic RV Percent Paced: 99 %
Date Time Interrogation Session: 20260205065740
Implantable Lead Connection Status: 753985
Implantable Lead Connection Status: 753985
Implantable Lead Implant Date: 20221109
Implantable Lead Implant Date: 20221109
Implantable Lead Location: 753859
Implantable Lead Location: 753860
Implantable Lead Model: 3830
Implantable Lead Model: 5076
Implantable Pulse Generator Implant Date: 20221109
Lead Channel Impedance Value: 342 Ohm
Lead Channel Impedance Value: 361 Ohm
Lead Channel Impedance Value: 475 Ohm
Lead Channel Impedance Value: 589 Ohm
Lead Channel Pacing Threshold Amplitude: 0.875 V
Lead Channel Pacing Threshold Amplitude: 0.875 V
Lead Channel Pacing Threshold Pulse Width: 0.4 ms
Lead Channel Pacing Threshold Pulse Width: 0.4 ms
Lead Channel Sensing Intrinsic Amplitude: 29.75 mV
Lead Channel Sensing Intrinsic Amplitude: 29.75 mV
Lead Channel Sensing Intrinsic Amplitude: 3.25 mV
Lead Channel Sensing Intrinsic Amplitude: 3.25 mV
Lead Channel Setting Pacing Amplitude: 1.5 V
Lead Channel Setting Pacing Amplitude: 2 V
Lead Channel Setting Pacing Pulse Width: 0.4 ms
Lead Channel Setting Sensing Sensitivity: 0.9 mV
Zone Setting Status: 755011
Zone Setting Status: 755011

## 2024-07-21 NOTE — Telephone Encounter (Signed)
 Patient has not been seen by Dr. Inocencio since 07/2021.   He will need to re-establish care, does live in [place], may want to see in [place] if cannot get in [address] office.   Please set up for next available.

## 2024-10-19 ENCOUNTER — Encounter

## 2025-01-18 ENCOUNTER — Encounter
